# Patient Record
Sex: Male | Born: 1995 | Race: Black or African American | Hispanic: No | Marital: Single | Smoking: Never smoker
Health system: Southern US, Community
[De-identification: ages and names within clinical notes are randomized; demographics above are authoritative.]

## PROBLEM LIST (undated history)

## (undated) DIAGNOSIS — N1330 Unspecified hydronephrosis: Secondary | ICD-10-CM

## (undated) DIAGNOSIS — N3289 Other specified disorders of bladder: Secondary | ICD-10-CM

## (undated) HISTORY — PX: COLONOSCOPY: SHX174

---

## 2016-05-18 DIAGNOSIS — N3289 Other specified disorders of bladder: Secondary | ICD-10-CM

## 2016-05-18 DIAGNOSIS — N1330 Unspecified hydronephrosis: Secondary | ICD-10-CM

## 2016-05-18 HISTORY — DX: Other specified disorders of bladder: N32.89

## 2016-05-18 HISTORY — DX: Unspecified hydronephrosis: N13.30

## 2016-05-26 ENCOUNTER — Other Ambulatory Visit: Payer: Self-pay | Admitting: Gastroenterology

## 2016-05-26 DIAGNOSIS — R1033 Periumbilical pain: Secondary | ICD-10-CM

## 2016-05-26 DIAGNOSIS — R634 Abnormal weight loss: Secondary | ICD-10-CM

## 2016-05-27 ENCOUNTER — Other Ambulatory Visit: Payer: Self-pay

## 2016-06-02 ENCOUNTER — Ambulatory Visit
Admission: RE | Admit: 2016-06-02 | Discharge: 2016-06-02 | Disposition: A | Discharge status: Home / Self Care | Payer: BLUE CROSS/BLUE SHIELD | Source: Ambulatory Visit | Attending: Gastroenterology | Admitting: Gastroenterology

## 2016-06-02 DIAGNOSIS — R634 Abnormal weight loss: Secondary | ICD-10-CM

## 2016-06-02 DIAGNOSIS — R1033 Periumbilical pain: Secondary | ICD-10-CM

## 2016-06-02 MED ORDER — IOPAMIDOL (ISOVUE-300) INJECTION 61%: 100.0000 mL | Freq: Once | INTRAVENOUS | Status: DC | PRN

## 2016-06-14 ENCOUNTER — Other Ambulatory Visit (HOSPITAL_COMMUNITY): Payer: Self-pay | Admitting: Urology

## 2016-06-14 DIAGNOSIS — N1330 Unspecified hydronephrosis: Secondary | ICD-10-CM

## 2016-06-17 ENCOUNTER — Ambulatory Visit (HOSPITAL_COMMUNITY)
Admission: RE | Admit: 2016-06-17 | Discharge: 2016-06-17 | Disposition: A | Discharge status: Home / Self Care | Payer: BLUE CROSS/BLUE SHIELD | Source: Ambulatory Visit | Attending: Urology | Admitting: Urology

## 2016-06-17 DIAGNOSIS — N134 Hydroureter: Secondary | ICD-10-CM | POA: Diagnosis not present

## 2016-06-17 DIAGNOSIS — N1330 Unspecified hydronephrosis: Secondary | ICD-10-CM | POA: Diagnosis not present

## 2016-06-17 MED ORDER — TECHNETIUM TC 99M MERTIATIDE
5.0300 | Freq: Once | INTRAVENOUS | Status: AC | PRN
  Administered 2016-06-17: 5.03 via INTRAVENOUS

## 2016-06-17 MED ORDER — FUROSEMIDE 10 MG/ML IJ SOLN
INTRAMUSCULAR | Status: AC
  Filled 2016-06-17: qty 4

## 2016-06-17 MED ORDER — FUROSEMIDE 10 MG/ML IJ SOLN
27.2700 mg | Freq: Once | INTRAMUSCULAR | Status: AC
  Administered 2016-06-17: 27.27 mg via INTRAVENOUS

## 2016-06-23 ENCOUNTER — Other Ambulatory Visit: Payer: Self-pay | Admitting: Urology

## 2016-07-01 ENCOUNTER — Encounter (HOSPITAL_BASED_OUTPATIENT_CLINIC_OR_DEPARTMENT_OTHER): Payer: Self-pay | Admitting: *Deleted

## 2016-07-01 DIAGNOSIS — N3081 Other cystitis with hematuria: Secondary | ICD-10-CM | POA: Diagnosis not present

## 2016-07-01 DIAGNOSIS — R3915 Urgency of urination: Secondary | ICD-10-CM | POA: Diagnosis not present

## 2016-07-01 DIAGNOSIS — N1330 Unspecified hydronephrosis: Secondary | ICD-10-CM | POA: Diagnosis present

## 2016-07-01 DIAGNOSIS — N401 Enlarged prostate with lower urinary tract symptoms: Secondary | ICD-10-CM | POA: Diagnosis not present

## 2016-07-01 NOTE — Progress Notes (Signed)
[facility]-Instructed Npo after Mn solids, clear liquids only until 0500,then Npo-Hg on arrival.

## 2016-07-03 NOTE — H&P (Signed)
HPI: Alexander Sims is a 20 year-old male with urinary symptoms.  The patient complains of lower urinary tract symptom(s) that include frequency, urgency, straining, and nocturia. He has had the symptom(s) for 20 years. His symptoms did begin gradually. He gets up at night to urinate 3-4 times. He does have urgency.   CT scan 06/02/16 - diffuse bladder thickening is noted with what was read as a "posterior bladder mass" that appears to be the prostate that is enlarged and inhomogeneous more than one would expect for a 20 year old male. in addition bilateral hydronephrosis is identified with what appears to be loss of some renal parenchyma consistent with long-standing hydronephrosis.   He reported that he has always had significant urinary frequency with nocturia and urgency. He said he does have to strain to urinate and will often times have a bowel movement with attempts at urination. He has seen what he described as blood in the urine in the past. Denies any dysuria but indicates that his urinary stream splits and sprays. He's never had an STD. He has never traveled outside of the country. He said after ejaculation he has significant urgency as well.     ALLERGIES: None   MEDICATIONS: None   GU PSH: None   NON-GU PSH: None   GU PMH: None   NON-GU PMH: Encounter for general adult medical examination without abnormal findings, Encounter for preventive health examination    FAMILY HISTORY: None   SOCIAL HISTORY: Marital Status: Single Current Smoking Status: Patient has never smoked.  Has never drank.  Does not drink caffeine.    REVIEW OF SYSTEMS:    GU Review Male:   Patient denies frequent urination, hard to postpone urination, burning/ pain with urination, get up at night to urinate, leakage of urine, stream starts and stops, trouble starting your stream, have to strain to urinate , erection problems, and penile pain.  Gastrointestinal (Upper):   Patient denies vomiting, indigestion/  heartburn, and nausea.  Gastrointestinal (Lower):   Patient denies diarrhea and constipation.  Constitutional:   Patient reports weight loss. Patient denies fever, night sweats, and fatigue.  Skin:   Patient reports skin rash/ lesion and itching.   Eyes:   Patient denies blurred vision and double vision.  Ears/ Nose/ Throat:   Patient denies sore throat and sinus problems.  Hematologic/Lymphatic:   Patient denies swollen glands and easy bruising.  Cardiovascular:   Patient denies leg swelling and chest pains.  Respiratory:   Patient denies cough and shortness of breath.  Endocrine:   Patient denies excessive thirst.  Musculoskeletal:   Patient denies back pain and joint pain.  Neurological:   Patient denies headaches and dizziness.  Psychologic:   Patient denies depression and anxiety.   VITAL SIGNS:    Weight 120 lb / 54.43 kg  Height 70 in / 177.8 cm  BP 109/70 mmHg  Pulse 91 /min  Temperature 97.4 F / 36 C  BMI 17.2 kg/m   GU PHYSICAL EXAMINATION:    Anus and Perineum: No hemorrhoids. No anal stenosis. No rectal fissure, no anal fissure. No edema, no dimple, no perineal tenderness, no anal tenderness.  Scrotum: No lesions. No edema. No cysts. No warts.  Epididymides: Right: no spermatocele, no masses, no cysts, no tenderness, no induration, no enlargement. Left: no spermatocele, no masses, no cysts, no tenderness, no induration, no enlargement.  Testes: No tenderness, no swelling, no enlargement left testes. No tenderness, no swelling, no enlargement right testes. Normal location left testes.  Normal location right testes. No mass, no cyst, no varicocele, no hydrocele left testes. No mass, no cyst, no varicocele, no hydrocele right testes.  Urethral Meatus: Normal size. No lesion, no wart, no discharge, no polyp. Normal location.  Penis: Circumcised, no warts, no cracks. No dorsal Peyronie's plaques, no left corporal Peyronie's plaques, no right corporal Peyronie's plaques, no  scarring, no warts. No balanitis, no meatal stenosis.  Prostate: Prostate 3 + size. Left lobe normal consistency, right lobe normal consistency. Symmetrical lobes. No prostate nodule. Left lobe no tenderness, right lobe no tenderness.   Seminal Vesicles: Nonpalpable.  Sphincter Tone: Normal sphincter. No rectal tenderness. No rectal mass.    MULTI-SYSTEM PHYSICAL EXAMINATION:    Constitutional: Well-nourished. No physical deformities. Normally developed. Good grooming.  Neck: Neck symmetrical, not swollen. Normal tracheal position.  Respiratory: No labored breathing, no use of accessory muscles.   Cardiovascular: Normal temperature, normal extremity pulses, no swelling, no varicosities.  Lymphatic: No enlargement of neck, axillae, groin.  Skin: No paleness, no jaundice, no cyanosis. No lesion, no ulcer, no rash.  Neurologic / Psychiatric: Oriented to time, oriented to place, oriented to person. No depression, no anxiety, no agitation.  Gastrointestinal: No mass, no tenderness, no rigidity, non obese abdomen.  Eyes: Normal conjunctivae. Normal eyelids.  Ears, Nose, Mouth, and Throat: Left ear no scars, no lesions, no masses. Right ear no scars, no lesions, no masses. Nose no scars, no lesions, no masses. Normal hearing. Normal lips.  Musculoskeletal: Normal gait and station of head and neck.     PAST DATA REVIEWED:  Source Of History:  Patient  Records Review:   Previous Hospital Records, POC Tool  X-Ray Review: C.T. Abdomen/Pelvis: Reviewed Films. Reviewed Report. Discussed With Patient.     PROCEDURES:          Urinalysis w/Scope Dipstick Dipstick Cont'd Micro  Color: Yellow Bilirubin: Neg WBC/hpf: NS (Not Seen)  Appearance: Cloudy Ketones: Neg RBC/hpf: 10 - 20/hpf  Specific Gravity: 1.020 Blood: 2+ Bacteria: Rare (0-9/hpf)  pH: 6.0 Protein: Trace Cystals: NS (Not Seen)  Glucose: Neg Urobilinogen: 0.2 Casts: NS (Not Seen)    Nitrites: Neg Trichomonas: Not Present    Leukocyte  Esterase: Neg Mucous: Not Present      Epithelial Cells: 0 - 5/hpf      Yeast: NS (Not Seen)      Sperm: Not Present    ASSESSMENT:      ICD-10 Details  1 GU:   Hydronephrosis Unspec - N13.30 Chronic - He has bilateral hydronephrosis with blunting of the calyces and what appears to be some degree of loss of renal parenchyma. This indicates long-standing obstruction. He appears to be obstructed at the level of the bladder likely due to the marked thickness of his bladder but this needs to be evaluated further. I will obtain a Lasix renogram and evaluate this with retrograde pyelography.  His renogram revealed partial obstruction with no high-grade or complete obstruction identified. 2   Urinary Urgency - R39.15 It appears he has long-standing voiding symptoms that may be due to some form of obstruction versus dysfunctional voiding.  3   BPH w/LUTS - N40.1 He has quite a large prostate noted on CT scan and on DRE. I believe the "mass" Mentioned by the radiologist in his bladder is most likely his prostate. We discussed evaluating him further under anesthesia with possibly even an incision of the prostate.          Notes:   I have discussed  with him the fact that he has a markedly abnormal urinary system including abnormality of the kidneys, ureters, bladder and prostate. This is going to require further evaluation under anesthesia with retrograde pyelography, possible stent placement, possible bladder biopsy, possible prostate biopsy and also possible other procedures based on my intraoperative findings. I have discussed this with him today as well as the outpatient nature of this procedure. He understands it is elected to proceed. I'm going to check a PSA and his creatinine and then obtained a Lasix renogram.    PLAN: Cystoscopy, bilateral retrograde pyelograms, possible stent placement, possible ureteroscopy, possible transurethral incision of the prostate.

## 2016-07-04 ENCOUNTER — Encounter (HOSPITAL_BASED_OUTPATIENT_CLINIC_OR_DEPARTMENT_OTHER): Payer: Self-pay

## 2016-07-04 ENCOUNTER — Ambulatory Visit (HOSPITAL_BASED_OUTPATIENT_CLINIC_OR_DEPARTMENT_OTHER)
Admission: RE | Admit: 2016-07-04 | Discharge: 2016-07-04 | Disposition: A | Discharge status: Home / Self Care | Payer: BLUE CROSS/BLUE SHIELD | Source: Ambulatory Visit | Attending: Urology | Admitting: Urology

## 2016-07-04 ENCOUNTER — Ambulatory Visit (HOSPITAL_BASED_OUTPATIENT_CLINIC_OR_DEPARTMENT_OTHER): Payer: BLUE CROSS/BLUE SHIELD | Admitting: Anesthesiology

## 2016-07-04 ENCOUNTER — Encounter (HOSPITAL_BASED_OUTPATIENT_CLINIC_OR_DEPARTMENT_OTHER)
Admission: RE | Disposition: A | Discharge status: Home / Self Care | Payer: Self-pay | Source: Ambulatory Visit | Attending: Urology

## 2016-07-04 DIAGNOSIS — N3081 Other cystitis with hematuria: Secondary | ICD-10-CM | POA: Insufficient documentation

## 2016-07-04 DIAGNOSIS — N401 Enlarged prostate with lower urinary tract symptoms: Secondary | ICD-10-CM | POA: Insufficient documentation

## 2016-07-04 DIAGNOSIS — N329 Bladder disorder, unspecified: Secondary | ICD-10-CM

## 2016-07-04 DIAGNOSIS — R3915 Urgency of urination: Secondary | ICD-10-CM | POA: Insufficient documentation

## 2016-07-04 DIAGNOSIS — N1330 Unspecified hydronephrosis: Secondary | ICD-10-CM | POA: Insufficient documentation

## 2016-07-04 HISTORY — PX: CYSTOSCOPY W/ RETROGRADES: SHX1426

## 2016-07-04 HISTORY — DX: Other specified disorders of bladder: N32.89

## 2016-07-04 HISTORY — DX: Unspecified hydronephrosis: N13.30

## 2016-07-04 HISTORY — PX: TRANSURETHRAL RESECTION OF BLADDER TUMOR: SHX2575

## 2016-07-04 LAB — POCT I-STAT, CHEM 8
BUN: 7 mg/dL (ref 6–20)
Calcium, Ion: 1.33 mmol/L (ref 1.15–1.40)
Chloride: 98 mmol/L — ABNORMAL LOW (ref 101–111)
Creatinine, Ser: 0.8 mg/dL (ref 0.61–1.24)
Glucose, Bld: 96 mg/dL (ref 65–99)
HCT: 46 % (ref 39.0–52.0)
Hemoglobin: 15.6 g/dL (ref 13.0–17.0)
Potassium: 3.7 mmol/L (ref 3.5–5.1)
Sodium: 142 mmol/L (ref 135–145)
TCO2: 28 mmol/L (ref 0–100)

## 2016-07-04 LAB — PSA: PSA: 0.54 ng/mL (ref 0.00–4.00)

## 2016-07-04 LAB — POCT HEMOGLOBIN-HEMACUE: Hemoglobin: 15 g/dL (ref 13.0–17.0)

## 2016-07-04 SURGERY — CYSTOSCOPY, WITH RETROGRADE PYELOGRAM
Anesthesia: General | Site: Bladder

## 2016-07-04 MED ORDER — STERILE WATER FOR IRRIGATION IR SOLN
Status: DC | PRN
  Administered 2016-07-04: 3000 mL

## 2016-07-04 MED ORDER — FENTANYL CITRATE (PF) 100 MCG/2ML IJ SOLN
INTRAMUSCULAR | Status: AC
  Filled 2016-07-04: qty 2

## 2016-07-04 MED ORDER — DEXAMETHASONE SODIUM PHOSPHATE 4 MG/ML IJ SOLN
INTRAMUSCULAR | Status: DC | PRN
  Administered 2016-07-04: 10 mg via INTRAVENOUS

## 2016-07-04 MED ORDER — PROMETHAZINE HCL 25 MG/ML IJ SOLN
6.2500 mg | INTRAMUSCULAR | Status: DC | PRN
Start: 2016-07-04 — End: 2016-07-04
  Filled 2016-07-04: qty 1

## 2016-07-04 MED ORDER — DEXAMETHASONE SODIUM PHOSPHATE 10 MG/ML IJ SOLN
INTRAMUSCULAR | Status: AC
  Filled 2016-07-04: qty 1

## 2016-07-04 MED ORDER — CIPROFLOXACIN IN D5W 400 MG/200ML IV SOLN
INTRAVENOUS | Status: AC
  Filled 2016-07-04: qty 200

## 2016-07-04 MED ORDER — ONDANSETRON HCL 4 MG/2ML IJ SOLN
INTRAMUSCULAR | Status: DC | PRN
  Administered 2016-07-04: 4 mg via INTRAVENOUS

## 2016-07-04 MED ORDER — LIDOCAINE 2% (20 MG/ML) 5 ML SYRINGE
INTRAMUSCULAR | Status: AC
  Filled 2016-07-04: qty 5

## 2016-07-04 MED ORDER — MIDAZOLAM HCL 5 MG/5ML IJ SOLN
INTRAMUSCULAR | Status: DC | PRN
  Administered 2016-07-04: 2 mg via INTRAVENOUS

## 2016-07-04 MED ORDER — IOHEXOL 300 MG/ML  SOLN
INTRAMUSCULAR | Status: DC | PRN
  Administered 2016-07-04: 10 mL

## 2016-07-04 MED ORDER — FENTANYL CITRATE (PF) 100 MCG/2ML IJ SOLN
INTRAMUSCULAR | Status: DC | PRN
  Administered 2016-07-04 (×5): 25 ug via INTRAVENOUS
  Administered 2016-07-04: 50 ug via INTRAVENOUS
  Administered 2016-07-04: 25 ug via INTRAVENOUS

## 2016-07-04 MED ORDER — FENTANYL CITRATE (PF) 100 MCG/2ML IJ SOLN
25.0000 ug | INTRAMUSCULAR | Status: DC | PRN
  Filled 2016-07-04: qty 1

## 2016-07-04 MED ORDER — SODIUM CHLORIDE 0.9 % IR SOLN
Status: DC | PRN
  Administered 2016-07-04 (×5): 3000 mL via INTRAVESICAL

## 2016-07-04 MED ORDER — PROPOFOL 10 MG/ML IV BOLUS
INTRAVENOUS | Status: AC
  Filled 2016-07-04: qty 40

## 2016-07-04 MED ORDER — MIDAZOLAM HCL 2 MG/2ML IJ SOLN
INTRAMUSCULAR | Status: AC
  Filled 2016-07-04: qty 2

## 2016-07-04 MED ORDER — PROPOFOL 10 MG/ML IV BOLUS
INTRAVENOUS | Status: DC | PRN
  Administered 2016-07-04: 50 mg via INTRAVENOUS
  Administered 2016-07-04: 200 mg via INTRAVENOUS

## 2016-07-04 MED ORDER — LACTATED RINGERS IV SOLN
INTRAVENOUS | Status: DC
  Administered 2016-07-04 (×2): via INTRAVENOUS
  Filled 2016-07-04: qty 1000

## 2016-07-04 MED ORDER — LIDOCAINE 2% (20 MG/ML) 5 ML SYRINGE
INTRAMUSCULAR | Status: DC | PRN
  Administered 2016-07-04: 100 mg via INTRAVENOUS

## 2016-07-04 MED ORDER — PHENAZOPYRIDINE HCL 200 MG PO TABS: 200.0000 mg | ORAL_TABLET | Freq: Three times a day (TID) | ORAL | 0 refills | Status: AC | PRN

## 2016-07-04 MED ORDER — LIDOCAINE HCL 2 % EX GEL
CUTANEOUS | Status: DC | PRN
  Administered 2016-07-04: 1

## 2016-07-04 MED ORDER — HYDROCODONE-ACETAMINOPHEN 10-325 MG PO TABS: 1.0000 | ORAL_TABLET | ORAL | 0 refills | Status: AC | PRN

## 2016-07-04 MED ORDER — CIPROFLOXACIN IN D5W 400 MG/200ML IV SOLN
400.0000 mg | INTRAVENOUS | Status: AC
  Administered 2016-07-04: 400 mg via INTRAVENOUS
  Filled 2016-07-04: qty 200

## 2016-07-04 MED ORDER — ONDANSETRON HCL 4 MG/2ML IJ SOLN
INTRAMUSCULAR | Status: AC
  Filled 2016-07-04: qty 2

## 2016-07-04 SURGICAL SUPPLY — 30 items
BAG DRAIN URO-CYSTO SKYTR STRL (DRAIN) ×3 IMPLANT
BAG URINE DRAINAGE (UROLOGICAL SUPPLIES) IMPLANT
BAG URINE LEG 19OZ MD ST LTX (BAG) IMPLANT
CATH FOLEY 2WAY SLVR  5CC 20FR (CATHETERS)
CATH FOLEY 2WAY SLVR  5CC 22FR (CATHETERS)
CATH FOLEY 2WAY SLVR  5CC 24FR (CATHETERS) ×1
CATH FOLEY 2WAY SLVR 5CC 20FR (CATHETERS) IMPLANT
CATH FOLEY 2WAY SLVR 5CC 22FR (CATHETERS) IMPLANT
CATH FOLEY 2WAY SLVR 5CC 24FR (CATHETERS) ×2 IMPLANT
CATH FOLEY 3WAY 20FR (CATHETERS) IMPLANT
CATH INTERMIT  6FR 70CM (CATHETERS) ×3 IMPLANT
CLOTH BEACON ORANGE TIMEOUT ST (SAFETY) ×3 IMPLANT
ELECT REM PT RETURN 9FT ADLT (ELECTROSURGICAL) ×3
ELECTRODE REM PT RTRN 9FT ADLT (ELECTROSURGICAL) ×2 IMPLANT
GLOVE BIO SURGEON STRL SZ8 (GLOVE) ×3 IMPLANT
GOWN STRL REUS W/ TWL LRG LVL3 (GOWN DISPOSABLE) ×2 IMPLANT
GOWN STRL REUS W/ TWL XL LVL3 (GOWN DISPOSABLE) ×2 IMPLANT
GOWN STRL REUS W/TWL LRG LVL3 (GOWN DISPOSABLE) ×1
GOWN STRL REUS W/TWL XL LVL3 (GOWN DISPOSABLE) ×1
GUIDEWIRE 0.038 PTFE COATED (WIRE) IMPLANT
GUIDEWIRE ANG ZIPWIRE 038X150 (WIRE) IMPLANT
GUIDEWIRE STR DUAL SENSOR (WIRE) ×3 IMPLANT
IV NS IRRIG 3000ML ARTHROMATIC (IV SOLUTION) ×15 IMPLANT
KIT ROOM TURNOVER WOR (KITS) ×3 IMPLANT
LOOP CUT BIPOLAR 24F LRG (ELECTROSURGICAL) ×3 IMPLANT
MANIFOLD NEPTUNE II (INSTRUMENTS) ×3 IMPLANT
PACK CYSTO (CUSTOM PROCEDURE TRAY) ×3 IMPLANT
SYRINGE IRR TOOMEY STRL 70CC (SYRINGE) IMPLANT
TUBE CONNECTING 12X1/4 (SUCTIONS) ×3 IMPLANT
WATER STERILE IRR 3000ML UROMA (IV SOLUTION) ×3 IMPLANT

## 2016-07-04 NOTE — Anesthesia Postprocedure Evaluation (Signed)
Anesthesia Post Note  Patient: Alexander Sims  Procedure(s) Performed: Procedure(s) (LRB): CYSTOSCOPY WITH RETROGRADE PYELOGRAM, BLADDER BIOPSY, PROSTATE INCISION (Bilateral) TRANSURETHRAL RESECTION OF BLADDER TUMOR (TURBT) (N/A)  Patient location during evaluation: PACU Anesthesia Type: General Level of consciousness: awake and alert Pain management: pain level controlled Vital Signs Assessment: post-procedure vital signs reviewed and stable Respiratory status: spontaneous breathing, nonlabored ventilation, respiratory function stable and patient connected to nasal cannula oxygen Cardiovascular status: blood pressure returned to baseline and stable Postop Assessment: no signs of nausea or vomiting Anesthetic complications: no       Last Vitals:  Vitals:   07/04/16 0913 07/04/16 1207  BP: 125/78 (!) 133/97  Pulse: 77 88  Resp: 16 (!) 8  Temp: 36.5 C (!) 35.6 C    Last Pain:  Vitals:   07/04/16 0913  TempSrc: Oral                 Kennieth RadFitzgerald, Alexander Sims

## 2016-07-04 NOTE — Anesthesia Preprocedure Evaluation (Addendum)
Anesthesia Evaluation  Patient identified by MRN, date of birth, ID band Patient awake    Reviewed: Allergy & Precautions, NPO status , Patient's Chart, lab work & pertinent test results  Airway Mallampati: II  TM Distance: >3 FB Neck ROM: Full    Dental  (+) Dental Advisory Given   Pulmonary neg pulmonary ROS,    breath sounds clear to auscultation       Cardiovascular negative cardio ROS   Rhythm:Regular Rate:Normal     Neuro/Psych negative neurological ROS     GI/Hepatic negative GI ROS, Neg liver ROS,   Endo/Other  negative endocrine ROS  Renal/GU Renal disease     Musculoskeletal   Abdominal   Peds  Hematology negative hematology ROS (+)   Anesthesia Other Findings   Reproductive/Obstetrics                            Lab Results  Component Value Date   HGB 15.0 07/04/2016   No results found for: CREATININE, BUN, NA, K, CL, CO2  Anesthesia Physical Anesthesia Plan  ASA: II  Anesthesia Plan: General   Post-op Pain Management:    Induction: Intravenous  Airway Management Planned: LMA  Additional Equipment:   Intra-op Plan:   Post-operative Plan: Extubation in OR  Informed Consent: I have reviewed the patients History and Physical, chart, labs and discussed the procedure including the risks, benefits and alternatives for the proposed anesthesia with the patient or authorized representative who has indicated his/her understanding and acceptance.   Dental advisory given  Plan Discussed with: CRNA  Anesthesia Plan Comments:         Anesthesia Quick Evaluation

## 2016-07-04 NOTE — Transfer of Care (Signed)
Immediate Anesthesia Transfer of Care Note  Patient: Alexander Sims  Procedure(s) Performed: Procedure(s) (LRB): CYSTOSCOPY WITH RETROGRADE PYELOGRAM, BLADDER BIOPSY, PROSTATE INCISION (Bilateral) TRANSURETHRAL RESECTION OF BLADDER TUMOR (TURBT) (N/A)  Patient Location: PACU  Anesthesia Type: General  Level of Consciousness: awake, sedated, patient cooperative and responds to stimulation  Airway & Oxygen Therapy: Patient Spontanous Breathing and Patient connected to [place] O2  Post-op Assessment: Report given to PACU RN, Post -op Vital signs reviewed and stable and Patient moving all extremities  Post vital signs: Reviewed and stable  Complications: No apparent anesthesia complications

## 2016-07-04 NOTE — Op Note (Addendum)
PATIENT:  Alexander Sims  PRE-OPERATIVE DIAGNOSIS: 1. Bilateral hydronephrosis 2. Thick bladder wall. 3. Possible prostate lesion.  POST-OPERATIVE DIAGNOSIS: Same  PROCEDURE: 1. Cystoscopy with resectional biopsy of bladder neck and trigone. (3 cm.) 2. Transurethral incision of the prostate SURGEON:  Garnett FarmMark C Kenniel Bergsma  INDICATION: Alexander Sims is a 20 year old male with a long-standing history of irritative as well as obstructive voiding symptoms. He was found on CT scan had a markedly thickened bladder circumferentially with bilateral hydronephrosis and changes consistent with this being a chronic condition. In addition there appeared to be an abnormality of the prostate with an inhomogeneous internal texture of the prostate with a possible intravesical component of either the prostate or some other form of neoplasm arising from the area of the prostate. He reports he has seen hematuria in the past. A Lasix renogram revealed that he was not completely obstructed and a preoperative serum creatinine was found to be normal at 0.8. He is brought to the operating room today for further evaluation of the above findings. I obtained a PSA preoperatively.  ANESTHESIA:  General  EBL:  Minimal  DRAINS: None  LOCAL MEDICATIONS USED: 2% lidocaine jelly per urethra  SPECIMEN:  Lesion from bladder neck, trigone and portion of prostate to pathology.  Description of procedure: After informed consent the patient was taken to the operating room and placed on the table in a supine position. General anesthesia was then administered. Once fully anesthetized the patient was moved to the dorsal lithotomy position and the genitalia were sterilely prepped and draped in standard fashion. An official timeout was then performed.  The 23 French rigid cystoscope with 30 lens was passed down the urethra which is noted be entirely normal. The sphincter was intact. There was no evidence of posterior urethral valves. The prostate  appeared to have a frondular areas of abnormality starting at about the mid prostate and extending to the bladder neck with large fingers of tissue protruding into the bladder from the bladder neck circumferentially. The trigone appeared to be involved with this process with some cobblestoning and reddish appearance more on the right than the left with no definite ureteral orifice identifiable. The bladder itself had normal-appearing mucosa upon entering the bladder with no bladder tumors or worrisome lesions of the mucosa once inside the bladder and beyond the area of the trigone. I felt that his bladder capacity seemed low so under anesthesia he was filled to capacity and found to have only a 250 mL capacity bladder.  The cystoscope was removed and I inserted the resectoscope sheath using a 26 French sheath and 30 lens with visual obturator. I removed the visual obturator and replaced this with the resectoscope element. I then began resecting this frondular tumor from the bladder neck circumferentially. It seemed very soft with very little resistance to resection and did not seem to be particularly vascular in nature. Bleeding points were cauterized as they were encountered. I then did a very long and thorough evaluation in an attempt to identify the right and left ureteral orifice but was unsuccessful. It appeared the process had enveloped both right and left orifice and so what I elected to do was resect through this area since it was clearly abnormal in appearance and was able to identify both of the ureters. As I resected through these I used the cutting current and could see the ureter protruding outward almost in a tubular fashion with no bleeding occurring around the so I did not need to use  cautery and feel that this will likely improve his kidney drainage with little risk of developing stricture although this will remain to be seen. I then cauterized all areas of bleeding and used the Microvasive  evacuator to remove all portions of the resected bladder neck lesion from the bladder. I then proceeded to perform a transurethral incision of the prostate.  Transurethral incision of the prostate was performed using the loop and cutting at the 6:00 position from the bladder neck nearly out to the veru. This was done due to his significant obstructive type symptoms but also in order to obtain tissue from the prostate. This was also sent to pathology. Cautery was used to clear up any further bleeding and at the end of the procedure there was no active bleeding occurring.  I elected not to proceed with retrograde pyelography due to the fact that initially I was unable to identify the ureteral orifice on either side and once I had resected away the tumor that had involved each of these I felt that attempting cannulization of these ureters could potentially injure or invert the ureter. Since I felt that my findings of this process involving both of his ureteral orifices was most likely at least a contributing factor in his hydronephrosis although his bladder wall thickening with marked loss of compliance of his bladder is also likely a factor.  I removed the resectoscope and instilled 2% lidocaine jelly in the urethra and applied a penile clamp. The patient was then awakened and taken to the recovery room in stable and satisfactory condition. He tolerated the procedure well with no intraoperative complications.  PLAN OF CARE: Discharge to home after PACU  PATIENT DISPOSITION:  PACU - hemodynamically stable.

## 2016-07-04 NOTE — Anesthesia Procedure Notes (Signed)
Procedure Name: LMA Insertion Date/Time: 07/04/2016 10:59 AM Performed by: Jessica PriestBEESON, Shihab States C Pre-anesthesia Checklist: Patient identified, Emergency Drugs available, Suction available and Patient being monitored Patient Re-evaluated:Patient Re-evaluated prior to inductionOxygen Delivery Method: Circle system utilized Preoxygenation: Pre-oxygenation with 100% oxygen Intubation Type: IV induction Ventilation: Mask ventilation without difficulty LMA: LMA inserted LMA Size: 4.0 Number of attempts: 1 Airway Equipment and Method: Bite block Placement Confirmation: positive ETCO2 and breath sounds checked- equal and bilateral Tube secured with: Tape Dental Injury: Teeth and Oropharynx as per pre-operative assessment

## 2016-07-04 NOTE — Discharge Instructions (Signed)

## 2016-07-05 ENCOUNTER — Encounter (HOSPITAL_BASED_OUTPATIENT_CLINIC_OR_DEPARTMENT_OTHER): Payer: Self-pay | Admitting: Urology

## 2016-10-14 ENCOUNTER — Emergency Department (HOSPITAL_COMMUNITY): Payer: BLUE CROSS/BLUE SHIELD | Admitting: Certified Registered Nurse Anesthetist

## 2016-10-14 ENCOUNTER — Encounter (HOSPITAL_COMMUNITY)
Admission: EM | Disposition: A | Discharge status: Home / Self Care | Payer: Self-pay | Source: Home / Self Care | Attending: Urology

## 2016-10-14 ENCOUNTER — Encounter (HOSPITAL_COMMUNITY): Payer: Self-pay | Admitting: *Deleted

## 2016-10-14 ENCOUNTER — Emergency Department (HOSPITAL_COMMUNITY): Payer: BLUE CROSS/BLUE SHIELD

## 2016-10-14 ENCOUNTER — Emergency Department (HOSPITAL_COMMUNITY)
Admit: 2016-10-14 | Discharge: 2016-10-14 | Disposition: A | Discharge status: Home / Self Care | Payer: BLUE CROSS/BLUE SHIELD | Attending: Urology | Admitting: Urology

## 2016-10-14 ENCOUNTER — Inpatient Hospital Stay (HOSPITAL_COMMUNITY)
Admission: EM | Admit: 2016-10-14 | Discharge: 2016-10-18 | DRG: 669 | Disposition: A | Discharge status: Home / Self Care | Payer: BLUE CROSS/BLUE SHIELD | Attending: Urology | Admitting: Urology

## 2016-10-14 DIAGNOSIS — N400 Enlarged prostate without lower urinary tract symptoms: Secondary | ICD-10-CM | POA: Diagnosis present

## 2016-10-14 DIAGNOSIS — N1330 Unspecified hydronephrosis: Secondary | ICD-10-CM

## 2016-10-14 DIAGNOSIS — D631 Anemia in chronic kidney disease: Secondary | ICD-10-CM | POA: Diagnosis present

## 2016-10-14 DIAGNOSIS — N179 Acute kidney failure, unspecified: Secondary | ICD-10-CM | POA: Diagnosis present

## 2016-10-14 DIAGNOSIS — N138 Other obstructive and reflux uropathy: Secondary | ICD-10-CM | POA: Diagnosis present

## 2016-10-14 DIAGNOSIS — E875 Hyperkalemia: Secondary | ICD-10-CM | POA: Diagnosis present

## 2016-10-14 DIAGNOSIS — E872 Acidosis: Secondary | ICD-10-CM | POA: Diagnosis present

## 2016-10-14 DIAGNOSIS — R634 Abnormal weight loss: Secondary | ICD-10-CM | POA: Diagnosis present

## 2016-10-14 DIAGNOSIS — Z681 Body mass index (BMI) 19 or less, adult: Secondary | ICD-10-CM

## 2016-10-14 DIAGNOSIS — Z9889 Other specified postprocedural states: Secondary | ICD-10-CM

## 2016-10-14 DIAGNOSIS — N3090 Cystitis, unspecified without hematuria: Secondary | ICD-10-CM | POA: Diagnosis present

## 2016-10-14 DIAGNOSIS — R531 Weakness: Secondary | ICD-10-CM | POA: Diagnosis present

## 2016-10-14 DIAGNOSIS — N131 Hydronephrosis with ureteral stricture, not elsewhere classified: Secondary | ICD-10-CM

## 2016-10-14 HISTORY — PX: PROSTATE BIOPSY: SHX241

## 2016-10-14 HISTORY — PX: CYSTOSCOPY W/ URETERAL STENT PLACEMENT: SHX1429

## 2016-10-14 LAB — BASIC METABOLIC PANEL
ANION GAP: 12 (ref 5–15)
Anion gap: 15 (ref 5–15)
BUN: 110 mg/dL — ABNORMAL HIGH (ref 6–20)
BUN: 97 mg/dL — ABNORMAL HIGH (ref 6–20)
CHLORIDE: 108 mmol/L (ref 101–111)
CHLORIDE: 109 mmol/L (ref 101–111)
CO2: 18 mmol/L — AB (ref 22–32)
CO2: 16 mmol/L — AB (ref 22–32)
CREATININE: 23.96 mg/dL — AB (ref 0.61–1.24)
CREATININE: 24.74 mg/dL — AB (ref 0.61–1.24)
Calcium: 9.4 mg/dL (ref 8.9–10.3)
Calcium: 9.6 mg/dL (ref 8.9–10.3)
GFR calc non Af Amer: 2 mL/min — ABNORMAL LOW (ref >60)
GFR calc non Af Amer: 2 mL/min — ABNORMAL LOW (ref >60)
GFR, EST AFRICAN AMERICAN: 3 mL/min — AB (ref >60)
GFR, EST AFRICAN AMERICAN: 3 mL/min — AB (ref >60)
GLUCOSE: 46 mg/dL — AB (ref 65–99)
Glucose, Bld: 98 mg/dL (ref 65–99)
Potassium: 6.2 mmol/L — ABNORMAL HIGH (ref 3.5–5.1)
Potassium: 4.6 mmol/L (ref 3.5–5.1)
SODIUM: 138 mmol/L (ref 135–145)
Sodium: 140 mmol/L (ref 135–145)

## 2016-10-14 LAB — CBC WITH DIFFERENTIAL/PLATELET
BASOS PCT: 0 %
Basophils Absolute: 0 10*3/uL (ref 0.0–0.1)
EOS ABS: 0.2 10*3/uL (ref 0.0–0.7)
EOS PCT: 3 %
HCT: 27.6 % — ABNORMAL LOW (ref 39.0–52.0)
Hemoglobin: 9.8 g/dL — ABNORMAL LOW (ref 13.0–17.0)
LYMPHS ABS: 1.4 10*3/uL (ref 0.7–4.0)
Lymphocytes Relative: 25 %
MCH: 27.9 pg (ref 26.0–34.0)
MCHC: 35.5 g/dL (ref 30.0–36.0)
MCV: 78.6 fL (ref 78.0–100.0)
MONOS PCT: 8 %
Monocytes Absolute: 0.5 10*3/uL (ref 0.1–1.0)
Neutro Abs: 3.6 10*3/uL (ref 1.7–7.7)
Neutrophils Relative %: 64 %
PLATELETS: 232 10*3/uL (ref 150–400)
RBC: 3.51 MIL/uL — ABNORMAL LOW (ref 4.22–5.81)
RDW: 13.8 % (ref 11.5–15.5)
WBC: 5.6 10*3/uL (ref 4.0–10.5)

## 2016-10-14 LAB — SURGICAL PCR SCREEN
MRSA, PCR: NEGATIVE
Staphylococcus aureus: NEGATIVE

## 2016-10-14 SURGERY — CYSTOSCOPY, WITH RETROGRADE PYELOGRAM AND URETERAL STENT INSERTION
Anesthesia: General | Site: Prostate

## 2016-10-14 MED ORDER — ONDANSETRON HCL 4 MG/2ML IJ SOLN: 4.0000 mg | INTRAMUSCULAR | Status: DC | PRN

## 2016-10-14 MED ORDER — PROPOFOL 10 MG/ML IV BOLUS
INTRAVENOUS | Status: DC | PRN
  Administered 2016-10-14: 160 mg via INTRAVENOUS

## 2016-10-14 MED ORDER — DEXTROSE 50 % IV SOLN
1.0000 | Freq: Once | INTRAVENOUS | Status: AC
  Administered 2016-10-14: 50 mL via INTRAVENOUS
  Filled 2016-10-14: qty 50

## 2016-10-14 MED ORDER — PROPOFOL 10 MG/ML IV BOLUS
INTRAVENOUS | Status: AC
  Filled 2016-10-14: qty 20

## 2016-10-14 MED ORDER — EPHEDRINE SULFATE-NACL 50-0.9 MG/10ML-% IV SOSY
PREFILLED_SYRINGE | INTRAVENOUS | Status: DC | PRN
  Administered 2016-10-14: 15 mg via INTRAVENOUS

## 2016-10-14 MED ORDER — FENTANYL CITRATE (PF) 100 MCG/2ML IJ SOLN
INTRAMUSCULAR | Status: DC | PRN
  Administered 2016-10-14 (×2): 25 ug via INTRAVENOUS

## 2016-10-14 MED ORDER — SODIUM CHLORIDE 0.9 % IV SOLN
INTRAVENOUS | Status: DC
  Administered 2016-10-14: 22:00:00 via INTRAVENOUS

## 2016-10-14 MED ORDER — SODIUM CHLORIDE 0.9 % IV SOLN
1.0000 g | Freq: Once | INTRAVENOUS | Status: AC
  Administered 2016-10-14: 1 g via INTRAVENOUS
  Filled 2016-10-14: qty 10

## 2016-10-14 MED ORDER — SODIUM CHLORIDE 0.9 % IV SOLN
INTRAVENOUS | Status: DC
  Administered 2016-10-14: 18:00:00 via INTRAVENOUS

## 2016-10-14 MED ORDER — HYDROMORPHONE HCL 1 MG/ML IJ SOLN
0.5000 mg | INTRAMUSCULAR | Status: DC | PRN
  Administered 2016-10-14: 0.5 mg via INTRAVENOUS
  Administered 2016-10-15 (×2): 1 mg via INTRAVENOUS
  Filled 2016-10-14 (×3): qty 1

## 2016-10-14 MED ORDER — IOPAMIDOL (ISOVUE-300) INJECTION 61%
INTRAVENOUS | Status: AC
  Filled 2016-10-14: qty 50

## 2016-10-14 MED ORDER — HYDROCODONE-ACETAMINOPHEN 5-325 MG PO TABS
1.0000 | ORAL_TABLET | ORAL | Status: DC | PRN
  Administered 2016-10-16 (×3): 2 via ORAL
  Filled 2016-10-14 (×3): qty 2

## 2016-10-14 MED ORDER — ONDANSETRON HCL 4 MG/2ML IJ SOLN
INTRAMUSCULAR | Status: DC | PRN
  Administered 2016-10-14: 4 mg via INTRAVENOUS

## 2016-10-14 MED ORDER — CISATRACURIUM BESYLATE 20 MG/10ML IV SOLN
INTRAVENOUS | Status: AC
  Filled 2016-10-14: qty 10

## 2016-10-14 MED ORDER — LIDOCAINE 2% (20 MG/ML) 5 ML SYRINGE
INTRAMUSCULAR | Status: DC | PRN
  Administered 2016-10-14: 60 mg via INTRAVENOUS

## 2016-10-14 MED ORDER — DEXTROSE 5 % IV SOLN
1.0000 g | Freq: Once | INTRAVENOUS | Status: AC
  Administered 2016-10-14: 1 g via INTRAVENOUS
  Filled 2016-10-14: qty 10

## 2016-10-14 MED ORDER — LIDOCAINE 2% (20 MG/ML) 5 ML SYRINGE
INTRAMUSCULAR | Status: AC
  Filled 2016-10-14: qty 5

## 2016-10-14 MED ORDER — FENTANYL CITRATE (PF) 100 MCG/2ML IJ SOLN
INTRAMUSCULAR | Status: AC
  Filled 2016-10-14: qty 2

## 2016-10-14 MED ORDER — SODIUM CHLORIDE 0.9 % IR SOLN
Status: DC | PRN
  Administered 2016-10-14: 13000 mL

## 2016-10-14 MED ORDER — 0.9 % SODIUM CHLORIDE (POUR BTL) OPTIME
TOPICAL | Status: DC | PRN
  Administered 2016-10-14: 1000 mL

## 2016-10-14 MED ORDER — DEXAMETHASONE SODIUM PHOSPHATE 10 MG/ML IJ SOLN
INTRAMUSCULAR | Status: DC | PRN
  Administered 2016-10-14: 10 mg via INTRAVENOUS

## 2016-10-14 MED ORDER — FENTANYL CITRATE (PF) 100 MCG/2ML IJ SOLN: 25.0000 ug | INTRAMUSCULAR | Status: DC | PRN

## 2016-10-14 MED ORDER — DEXAMETHASONE SODIUM PHOSPHATE 10 MG/ML IJ SOLN
INTRAMUSCULAR | Status: AC
  Filled 2016-10-14: qty 1

## 2016-10-14 MED ORDER — PHENYLEPHRINE 40 MCG/ML (10ML) SYRINGE FOR IV PUSH (FOR BLOOD PRESSURE SUPPORT)
PREFILLED_SYRINGE | INTRAVENOUS | Status: DC | PRN
  Administered 2016-10-14 (×3): 80 ug via INTRAVENOUS

## 2016-10-14 MED ORDER — STERILE WATER FOR IRRIGATION IR SOLN
Status: DC | PRN
  Administered 2016-10-14: 500 mL

## 2016-10-14 MED ORDER — SODIUM POLYSTYRENE SULFONATE 15 GM/60ML PO SUSP
45.0000 g | Freq: Once | ORAL | Status: AC
  Administered 2016-10-14: 45 g via ORAL
  Filled 2016-10-14 (×2): qty 180

## 2016-10-14 MED ORDER — ONDANSETRON HCL 4 MG/2ML IJ SOLN
INTRAMUSCULAR | Status: AC
  Filled 2016-10-14: qty 2

## 2016-10-14 MED ORDER — SENNOSIDES-DOCUSATE SODIUM 8.6-50 MG PO TABS
1.0000 | ORAL_TABLET | Freq: Two times a day (BID) | ORAL | Status: DC
  Administered 2016-10-14 – 2016-10-15 (×2): 1 via ORAL
  Filled 2016-10-14 (×5): qty 1

## 2016-10-14 MED ORDER — INSULIN ASPART 100 UNIT/ML ~~LOC~~ SOLN
10.0000 [IU] | Freq: Once | SUBCUTANEOUS | Status: AC
  Administered 2016-10-14: 10 [IU] via SUBCUTANEOUS
  Filled 2016-10-14: qty 1

## 2016-10-14 SURGICAL SUPPLY — 24 items
BAG URO CATCHER STRL LF (MISCELLANEOUS) ×3 IMPLANT
BASKET ZERO TIP NITINOL 2.4FR (BASKET) IMPLANT
CATH FOLEY 3WAY 30CC 24FR (CATHETERS) ×1
CATH INTERMIT  6FR 70CM (CATHETERS) IMPLANT
CATH URTH STD 24FR FL 3W 2 (CATHETERS) ×2 IMPLANT
CLOTH BEACON ORANGE TIMEOUT ST (SAFETY) ×3 IMPLANT
ELECT LOOP 22F BIPOLAR SML (ELECTROSURGICAL) ×3
ELECTRODE LOOP 22F BIPOLAR SML (ELECTROSURGICAL) ×2 IMPLANT
GLOVE BIO SURGEON STRL SZ 6.5 (GLOVE) ×3 IMPLANT
GLOVE BIOGEL M STRL SZ7.5 (GLOVE) ×3 IMPLANT
GLOVE BIOGEL PI IND STRL 6.5 (GLOVE) ×2 IMPLANT
GLOVE BIOGEL PI INDICATOR 6.5 (GLOVE) ×1
GOWN STRL REUS W/ TWL XL LVL3 (GOWN DISPOSABLE) ×2 IMPLANT
GOWN STRL REUS W/TWL LRG LVL3 (GOWN DISPOSABLE) ×6 IMPLANT
GOWN STRL REUS W/TWL XL LVL3 (GOWN DISPOSABLE) ×1
GUIDEWIRE ANG ZIPWIRE 038X150 (WIRE) ×3 IMPLANT
GUIDEWIRE STR DUAL SENSOR (WIRE) ×6 IMPLANT
INSTR BIOPSY MAXCORE 18GX20 (NEEDLE) ×3 IMPLANT
MANIFOLD NEPTUNE II (INSTRUMENTS) ×3 IMPLANT
PACK CYSTO (CUSTOM PROCEDURE TRAY) ×3 IMPLANT
PLUG CATH AND CAP STER (CATHETERS) IMPLANT
SYR 30ML LL (SYRINGE) ×3 IMPLANT
SYRINGE IRR TOOMEY STRL 70CC (SYRINGE) ×3 IMPLANT
TUBING CONNECTING 10 (TUBING) ×3 IMPLANT

## 2016-10-14 NOTE — Consult Note (Signed)
21 year old male was evaluated in 2017 for a 40 pound weight loss and diarrhea. (Hx obtained from med record as pt sleepy post op).  CT Abdomen Jun 02, 2016 revealed: Moderate to marked diffuse bladder wall thickening and 2.9 x 2.4 x 2.0 cm mass in the inferior aspect of the urinary bladder, adjacent to a moderately enlarged and heterogeneous prostate. These changes are producing moderate-to-marked bilateral hydronephrosis and hydroureter. The bladder mass could represent a primary bladder malignancy or extension of the enlarged and heterogeneous prostate gland into the base of the urinary bladder. The enlarged and heterogeneous prostate gland could be due to prostatitis or, less likely, malignancy. The diffuse bladder wall thickening could be due to malignancy, chronic cystitis or chronic bladder outlet obstruction. Correlation with cystoscopy and biopsy is recommended.  Nuclear medicine renal scan on 06/17/16 revealed: Prompt symmetric arterial flow to the kidneys; Left renogram: There is normal time to maximum cortical uptake and normal time to excretion. However, the renal pelvis, calices, and ureter are dilated and only partially washout after Lasix.  Right renogram: There is normal time to maximum cortical uptake and normal time to excretion. However, the renal pelvis, calices, and ureter are dilated and only partially washout after Lasix.  A TURP in Dec 2017 did not show malignancy.  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  He represented with facial swelling and weakness of several days. He had c/o some diff initiating his stream.  He had labs done which were abnormal and he was sent to the [facility] ED for BUN of 110 and creat of 23.9, K was 6.2.  Of note on 07/04/16 BUN was 7 and creat 0.'8mg'$ /dl.  He was evaluated by Dr. Tammi Klippel today and he was unable to cystoscopically place ureteral stents due to obstruction of ureteral orifices from mass effect, and plan is  for bilat PCNs in AM.   Past Medical History:  Diagnosis Date  . Bladder mass 05/2016  . Hydronephrosis, bilateral 05/2016   Past Surgical History:  Procedure Laterality Date  . COLONOSCOPY    . CYSTOSCOPY W/ RETROGRADES Bilateral 07/04/2016   Procedure: CYSTOSCOPY WITH RETROGRADE PYELOGRAM, BLADDER BIOPSY, PROSTATE INCISION;  Surgeon: Kathie Rhodes, MD;  Location: [place];  Service: Urology;  Laterality: Bilateral;  . TRANSURETHRAL RESECTION OF BLADDER TUMOR N/A 07/04/2016   Procedure: TRANSURETHRAL RESECTION OF BLADDER TUMOR (TURBT);  Surgeon: Kathie Rhodes, MD;  Location: [facility];  Service: Urology;  Laterality: N/A;   Social History:  reports that he has never smoked. He has never used smokeless tobacco. He reports that he does not use drugs. His alcohol history is not on file. Allergies: No Known Allergies No family history on file.  Medications:  Prior to Admission:  Prescriptions Prior to Admission  Medication Sig Dispense Refill Last Dose  . HYDROcodone-acetaminophen (NORCO) 10-325 MG tablet Take 1-2 tablets by mouth every 4 (four) hours as needed for moderate pain. Maximum dose per 24 hours - 8 pills (Patient not taking: Reported on 10/14/2016) 30 tablet 0 Not Taking at Unknown time  . phenazopyridine (PYRIDIUM) 200 MG tablet Take 1 tablet (200 mg total) by mouth 3 (three) times daily as needed for pain. (Patient not taking: Reported on 10/14/2016) 30 tablet 0 Not Taking at Unknown time   Scheduled: . sodium polystyrene  45 g Oral Once     ROS: not obtainable Blood pressure (!) 146/78, pulse 86, temperature 97.2 F (36.2 C), resp. rate 14, SpO2 100 %.  General appearance: post op sedation Head: Normocephalic, without obvious abnormality, atraumatic Eyes: negative Resp: clear to auscultation bilaterally Chest wall: no tenderness Cardio: regular rate and rhythm, S1, S2 normal, no murmur, click, rub or gallop GI: soft, non-tender;  bowel sounds normal; no masses,  no organomegaly Extremities: edema tr Skin: Skin color, texture, turgor normal. No rashes or lesions Neurologic: Grossly normal Results for orders placed or performed during the hospital encounter of 10/14/16 (from the past 48 hour(s))  CBC with Differential/Platelet     Status: Abnormal   Collection Time: 10/14/16  1:39 PM  Result Value Ref Range   WBC 5.6 4.0 - 10.5 K/uL   RBC 3.51 (L) 4.22 - 5.81 MIL/uL   Hemoglobin 9.8 (L) 13.0 - 17.0 g/dL   HCT 27.6 (L) 39.0 - 52.0 %   MCV 78.6 78.0 - 100.0 fL   MCH 27.9 26.0 - 34.0 pg   MCHC 35.5 30.0 - 36.0 g/dL   RDW 13.8 11.5 - 15.5 %   Platelets 232 150 - 400 K/uL   Neutrophils Relative % 64 %   Neutro Abs 3.6 1.7 - 7.7 K/uL   Lymphocytes Relative 25 %   Lymphs Abs 1.4 0.7 - 4.0 K/uL   Monocytes Relative 8 %   Monocytes Absolute 0.5 0.1 - 1.0 K/uL   Eosinophils Relative 3 %   Eosinophils Absolute 0.2 0.0 - 0.7 K/uL   Basophils Relative 0 %   Basophils Absolute 0.0 0.0 - 0.1 K/uL  Basic metabolic panel     Status: Abnormal   Collection Time: 10/14/16  1:39 PM  Result Value Ref Range   Sodium 138 135 - 145 mmol/L   Potassium 6.2 (H) 3.5 - 5.1 mmol/L   Chloride 108 101 - 111 mmol/L   CO2 18 (L) 22 - 32 mmol/L   Glucose, Bld 98 65 - 99 mg/dL   BUN 110 (H) 6 - 20 mg/dL    Comment: RESULTS CONFIRMED BY MANUAL DILUTION   Creatinine, Ser 23.96 (H) 0.61 - 1.24 mg/dL   Calcium 9.4 8.9 - 10.3 mg/dL   GFR calc non Af Amer 2 (L) >60 mL/min   GFR calc Af Amer 3 (L) >60 mL/min    Comment: (NOTE) The eGFR has been calculated using the CKD EPI equation. This calculation has not been validated in all clinical situations. eGFR's persistently <60 mL/min signify possible Chronic Kidney Disease.    Anion gap 12 5 - 15  Surgical pcr screen     Status: None   Collection Time: 10/14/16  6:00 PM  Result Value Ref Range   MRSA, PCR NEGATIVE NEGATIVE   Staphylococcus aureus NEGATIVE NEGATIVE    Comment:        The  Xpert SA Assay (FDA approved for NASAL specimens in patients over 11 years of age), is one component of a comprehensive surveillance program.  Test performance has been validated by [facility] for patients greater than or equal to 65 year old. It is not intended to diagnose infection nor to guide or monitor treatment.    [place] Intraoperative  Result Date: 10/14/2016 CLINICAL DATA:  Ultrasound was provided for use by the ordering physician, and a technical charge was applied by the performing facility.  No radiologist interpretation/professional services rendered.   Ct Renal Stone Study  Result Date: 10/14/2016 CLINICAL DATA:  Abdominal pain. History of bladder lesion and hydronephrosis. EXAM: CT ABDOMEN AND PELVIS WITHOUT CONTRAST TECHNIQUE: Multidetector CT imaging of the abdomen and pelvis was performed  following the standard protocol without oral or intravenous contrast material administration. COMPARISON:  June 02, 2016 FINDINGS: Lower chest: Lung bases are clear. Hepatobiliary: No focal liver lesions are appreciable on this noncontrast enhanced study. Gallbladder wall is not appreciably thickened. There is no biliary duct dilatation. Pancreas: No pancreatic mass or inflammatory focus. Spleen: No splenic lesions are evident. Adrenals/Urinary Tract: Adrenals appear unremarkable. There is marked hydronephrosis bilaterally with diffuse ureterectasis to the level of the urinary bladder. There is no renal mass. No calculi noted in either kidney or ureter. The urinary bladder once again shows diffuse wall thickening in a circumferential manner. No well-defined bladder mass is evident. Stomach/Bowel: There is no appreciable bowel wall thickening. There is somewhat generalized mesenteric thickening due to anasarca. There is no bowel obstruction. No free air or portal venous air. Vascular/Lymphatic: No abdominal aortic aneurysm. No vascular lesions are evident. There is no adenopathy in the abdomen  or pelvis. Reproductive: Prostate is enlarged for age. Seminal vesicles appear unremarkable. Other: Appendix appears unremarkable. No abscess or ascites evident. Musculoskeletal: There are no blastic or lytic bone lesions. There is generalized anasarca. There are no intramuscular lesions. IMPRESSION: Generalized anasarca, likely due to chronic renal obstruction bilaterally. There is again noted severe hydronephrosis and ureterectasis bilaterally with diffuse urinary bladder wall thickening, likely impressing on the respective ureterovesical junction stone causing hydronephrosis in ureterectasis due to the abnormality in the urinary bladder. There is no well-defined urinary bladder mass. The appearance raises question of a chronic inflammatory type lesion causing the generalized bladder wall thickening. It should be noted that neoplastic etiology cannot be excluded. Prostate is enlarged for age. The significance of this finding in light of the other findings is uncertain. There may be a degree of chronic bladder outlet obstruction due to the prostate enlargement. Urologic assessment is advised given these findings. Note that there are no renal or ureteral calculi. Note that the appearance of the prostate, bladder, and kidneys is similar to prior study. No bowel obstruction.  Appendix appears normal.  No adenopathy. Electronically Signed   By: Lowella Grip III M.D.   On: 10/14/2016 15:10    Assessment:  1 AKI due to obstructive uropathy due to bladder/prostate mass 2 Hyperkalemia due to #1 3 Anemia of kidney dz 4 Chronic bilateral hydronephrosis 5 Bladder/prostate mass of unknown cause  Plan: 1 Kaexylate 2 PCNs in AM and pray for improvement in renal function   Garfield Coiner C 10/14/2016, 8:54 PM

## 2016-10-14 NOTE — ED Provider Notes (Addendum)
[facility]-EMERGENCY DEPT Provider Note   CSN: [id number] Arrival date & time: 10/14/16  1210     History   Chief Complaint Chief Complaint  Patient presents with  . Abnormal Lab    HPI Alexander Sims is a 21 y.o. male.  21 year old male presents with facial swelling times several days along with weakness. Has a history of a bladder mass and was seen by his urologist yesterday and have blood work performed. Was called today after he had elevated creatinine and was sent here for further evaluation. Patient states that he has had trouble with starting a stream of urine but is since improved. Denies any flank pain. No fever or chills. No nausea vomiting. No treatment use prior to arrival.      Past Medical History:  Diagnosis Date  . Bladder mass 05/2016  . Hydronephrosis, bilateral 05/2016    There are no active problems to display for this patient.   Past Surgical History:  Procedure Laterality Date  . COLONOSCOPY    . CYSTOSCOPY W/ RETROGRADES Bilateral 07/04/2016   Procedure: CYSTOSCOPY WITH RETROGRADE PYELOGRAM, BLADDER BIOPSY, PROSTATE INCISION;  Surgeon: Ihor Gully, MD;  Location: [facility];  Service: Urology;  Laterality: Bilateral;  . TRANSURETHRAL RESECTION OF BLADDER TUMOR N/A 07/04/2016   Procedure: TRANSURETHRAL RESECTION OF BLADDER TUMOR (TURBT);  Surgeon: Ihor Gully, MD;  Location: [facility];  Service: Urology;  Laterality: N/A;       Home Medications    Prior to Admission medications   Medication Sig Start Date End Date Taking? Authorizing Provider  HYDROcodone-acetaminophen (NORCO) 10-325 MG tablet Take 1-2 tablets by mouth every 4 (four) hours as needed for moderate pain. Maximum dose per 24 hours - 8 pills Patient not taking: Reported on 10/14/2016 07/04/16   Ihor Gully, MD  phenazopyridine (PYRIDIUM) 200 MG tablet Take 1 tablet (200 mg total) by mouth 3 (three) times daily as needed for pain. Patient not taking:  Reported on 10/14/2016 07/04/16   Ihor Gully, MD    Family History No family history on file.  Social History Social History  Substance Use Topics  . Smoking status: Never Smoker  . Smokeless tobacco: Never Used  . Alcohol use Not on file     Allergies   Patient has no known allergies.   Review of Systems Review of Systems  All other systems reviewed and are negative.    Physical Exam Updated Vital Signs BP (!) 163/74 (BP Location: Left Arm)   Pulse 81   Temp 98.9 F (37.2 C) (Oral)   Resp 18   SpO2 100%   Physical Exam  Constitutional: He is oriented to person, place, and time. He appears well-developed and well-nourished.  Non-toxic appearance. No distress.  HENT:  Head: Normocephalic and atraumatic.  Some facial edema appreciated.  Eyes: Conjunctivae, EOM and lids are normal. Pupils are equal, round, and reactive to light.  Neck: Normal range of motion. Neck supple. No tracheal deviation present. No thyroid mass present.  Cardiovascular: Normal rate, regular rhythm and normal heart sounds.  Exam reveals no gallop.   No murmur heard. Pulmonary/Chest: Effort normal and breath sounds normal. No stridor. No respiratory distress. He has no decreased breath sounds. He has no wheezes. He has no rhonchi. He has no rales.  Abdominal: Soft. Normal appearance and bowel sounds are normal. He exhibits no distension. There is no tenderness. There is no rebound and no CVA tenderness.  Musculoskeletal: Normal range of motion. He exhibits no edema  or tenderness.  Neurological: He is alert and oriented to person, place, and time. He has normal strength. No cranial nerve deficit or sensory deficit. GCS eye subscore is 4. GCS verbal subscore is 5. GCS motor subscore is 6.  Skin: Skin is warm and dry. No abrasion and no rash noted.  Psychiatric: He has a normal mood and affect. His speech is normal and behavior is normal.  Nursing note and vitals reviewed.    ED Treatments /  Results  Labs (all labs ordered are listed, but only abnormal results are displayed) Labs Reviewed  CBC WITH DIFFERENTIAL/PLATELET - Abnormal; Notable for the following:       Result Value   RBC 3.51 (*)    Hemoglobin 9.8 (*)    HCT 27.6 (*)    All other components within normal limits  BASIC METABOLIC PANEL - Abnormal; Notable for the following:    Potassium 6.2 (*)    CO2 18 (*)    BUN 110 (*)    Creatinine, Ser 23.96 (*)    GFR calc non Af Amer 2 (*)    GFR calc Af Amer 3 (*)    All other components within normal limits    EKG  EKG Interpretation  Date/Time:  Friday October 14 2016 15:40:12 EDT Ventricular Rate:  76 PR Interval:    QRS Duration: 87 QT Interval:  366 QTC Calculation: 412 R Axis:   9 Text Interpretation:  Sinus rhythm Confirmed by Freida Busman  MD, Trey Bebee ([id number]) on 10/14/2016 3:42:27 PM       Radiology Ct Renal Stone Study  Result Date: 10/14/2016 CLINICAL DATA:  Abdominal pain. History of bladder lesion and hydronephrosis. EXAM: CT ABDOMEN AND PELVIS WITHOUT CONTRAST TECHNIQUE: Multidetector CT imaging of the abdomen and pelvis was performed following the standard protocol without oral or intravenous contrast material administration. COMPARISON:  June 02, 2016 FINDINGS: Lower chest: Lung bases are clear. Hepatobiliary: No focal liver lesions are appreciable on this noncontrast enhanced study. Gallbladder wall is not appreciably thickened. There is no biliary duct dilatation. Pancreas: No pancreatic mass or inflammatory focus. Spleen: No splenic lesions are evident. Adrenals/Urinary Tract: Adrenals appear unremarkable. There is marked hydronephrosis bilaterally with diffuse ureterectasis to the level of the urinary bladder. There is no renal mass. No calculi noted in either kidney or ureter. The urinary bladder once again shows diffuse wall thickening in a circumferential manner. No well-defined bladder mass is evident. Stomach/Bowel: There is no appreciable bowel  wall thickening. There is somewhat generalized mesenteric thickening due to anasarca. There is no bowel obstruction. No free air or portal venous air. Vascular/Lymphatic: No abdominal aortic aneurysm. No vascular lesions are evident. There is no adenopathy in the abdomen or pelvis. Reproductive: Prostate is enlarged for age. Seminal vesicles appear unremarkable. Other: Appendix appears unremarkable. No abscess or ascites evident. Musculoskeletal: There are no blastic or lytic bone lesions. There is generalized anasarca. There are no intramuscular lesions. IMPRESSION: Generalized anasarca, likely due to chronic renal obstruction bilaterally. There is again noted severe hydronephrosis and ureterectasis bilaterally with diffuse urinary bladder wall thickening, likely impressing on the respective ureterovesical junction stone causing hydronephrosis in ureterectasis due to the abnormality in the urinary bladder. There is no well-defined urinary bladder mass. The appearance raises question of a chronic inflammatory type lesion causing the generalized bladder wall thickening. It should be noted that neoplastic etiology cannot be excluded. Prostate is enlarged for age. The significance of this finding in light of the other findings is uncertain.  There may be a degree of chronic bladder outlet obstruction due to the prostate enlargement. Urologic assessment is advised given these findings. Note that there are no renal or ureteral calculi. Note that the appearance of the prostate, bladder, and kidneys is similar to prior study. No bowel obstruction.  Appendix appears normal.  No adenopathy. Electronically Signed   By: Bretta Bang III M.D.   On: 10/14/2016 15:10    Procedures Procedures (including critical care time)  Medications Ordered in ED Medications  0.9 %  sodium chloride infusion (not administered)  insulin aspart (novoLOG) injection 10 Units (not administered)  dextrose 50 % solution 50 mL (not  administered)  calcium gluconate 1 g in sodium chloride 0.9 % 100 mL IVPB (not administered)     Initial Impression / Assessment and Plan / ED Course  I have reviewed the triage vital signs and the nursing notes.  Pertinent labs & imaging results that were available during my care of the patient were reviewed by me and considered in my medical decision making (see chart for details).     Patient evidence of hyperkalemia was treated with insulin, glucose, calcium. Will discuss case with urologist on call who will likely come to admit the patient.   CRITICAL CARE Performed by: Toy Baker Total critical care time: 45 minutes Critical care time was exclusive of separately billable procedures and treating other patients. Critical care was necessary to treat or prevent imminent or life-threatening deterioration. Critical care was time spent personally by me on the following activities: development of treatment plan with patient and/or surrogate as well as nursing, discussions with consultants, evaluation of patient's response to treatment, examination of patient, obtaining history from patient or surrogate, ordering and performing treatments and interventions, ordering and review of laboratory studies, ordering and review of radiographic studies, pulse oximetry and re-evaluation of patient's condition.   Final Clinical Impressions(s) / ED Diagnoses   Final diagnoses:  None    New Prescriptions New Prescriptions   No medications on file     Lorre Nick, MD 10/14/16 1516    Lorre Nick, MD 10/14/16 1533    Lorre Nick, MD 10/14/16 [phone]

## 2016-10-14 NOTE — Transfer of Care (Signed)
Immediate Anesthesia Transfer of Care Note  Patient: Alexander Sims  Procedure(s) Performed: Procedure(s): CYSTOSCOPY AND BLADDER BIOPSY WITH FULGERATION (Bilateral) TRANSRECTAL ULTRASOUND FOR PROSTATE BIOPSY (N/A)  Patient Location: PACU  Anesthesia Type:General  Level of Consciousness: sedated, patient cooperative and responds to stimulation  Airway & Oxygen Therapy: Patient Spontanous Breathing and Patient connected to face mask oxygen  Post-op Assessment: Report given to RN and Post -op Vital signs reviewed and stable  Post vital signs: Reviewed and stable  Last Vitals:  Vitals:   10/14/16 1221 10/14/16 1600  BP: (!) 163/74 (!) 159/98  Pulse: 81 72  Resp: 18 15  Temp: 37.2 C     Last Pain:  Vitals:   10/14/16 1221  TempSrc: Oral         Complications: No apparent anesthesia complications

## 2016-10-14 NOTE — ED Triage Notes (Signed)
Pt states he was sent by PCP due to labs showing a problem with his kidneys. Pt states he has had swelling in his face and knees for the past 2 days.

## 2016-10-14 NOTE — ED Notes (Signed)
Patient has 3 bags of belongings (1 large black duffel, 1 black backpack, and 1 hospital patient belongings bag) are now stored in ED belongings cabinet. Told that security lockers are not large enough for bags. Will store in ED and can be moved to floor when patient is assigned room. OR not able to take.

## 2016-10-14 NOTE — Anesthesia Preprocedure Evaluation (Signed)
Anesthesia Evaluation  Patient identified by MRN, date of birth, ID bandGeneral Assessment Comment:Sleepy but alert when engaged  Reviewed: Allergy & Precautions, NPO status , Patient's Chart, lab work & pertinent test results  Airway Mallampati: I  TM Distance: >3 FB Neck ROM: Full    Dental  (+) Teeth Intact   Pulmonary neg pulmonary ROS,    breath sounds clear to auscultation- rhonchi       Cardiovascular negative cardio ROS   Rhythm:Regular     Neuro/Psych negative neurological ROS  negative psych ROS   GI/Hepatic negative GI ROS, Neg liver ROS,   Endo/Other  negative endocrine ROS  Renal/GU ARFRenal diseaseARF Cr > 20 with bladder mass  negative genitourinary   Musculoskeletal negative musculoskeletal ROS (+)   Abdominal   Peds negative pediatric ROS (+)  Hematology negative hematology ROS (+)   Anesthesia Other Findings   Reproductive/Obstetrics negative OB ROS                             Anesthesia Physical Anesthesia Plan  ASA: III  Anesthesia Plan: General   Post-op Pain Management:    Induction: Intravenous  Airway Management Planned: LMA  Additional Equipment: None  Intra-op Plan:   Post-operative Plan: Extubation in OR  Informed Consent: I have reviewed the patients History and Physical, chart, labs and discussed the procedure including the risks, benefits and alternatives for the proposed anesthesia with the patient or authorized representative who has indicated his/her understanding and acceptance.   Dental advisory given  Plan Discussed with: CRNA and Surgeon  Anesthesia Plan Comments:         Anesthesia Quick Evaluation

## 2016-10-14 NOTE — H&P (Signed)
Alexander Sims is an 21 y.o. male.    Chief Complaint: Acute on Chronic Renal Failure, Severe Bilateral Hydronephrosis, Bladder-Prostate Thickening / Rule Out Neoplasm  HPI:   1 - Acute on Chronic Renal Failure - Cr >20 with BUN >100 by office labs yesterday and confirmed on ER labs today. Mild hyperkalemia to 6.2. Baseline Cr <1.5.   Denies new nephrotoxin exposure / dehydration.   2 -  Severe Bilateral Hydronephrosis - worsening bilateral hydro to bladder by ER CT 10/14/16.   3- Bladder-Prostate Thickening / Rule Out Neoplasm - s/p TUR bladder neck 06/2016, path at that time favor benign changes. Prior contrast CT with abnormal thickening and enhancement of prostate-bladder neck. PSA normal (>0.6).   PMH otherwise unremarkable. NO CV disease. NO chest / abd surgeries.  Today "Alexander Sims" is seen for urgent evaluation of above. Last meal yesterday.    Past Medical History:  Diagnosis Date  . Bladder mass 05/2016  . Hydronephrosis, bilateral 05/2016    Past Surgical History:  Procedure Laterality Date  . COLONOSCOPY    . CYSTOSCOPY W/ RETROGRADES Bilateral 07/04/2016   Procedure: CYSTOSCOPY WITH RETROGRADE PYELOGRAM, BLADDER BIOPSY, PROSTATE INCISION;  Surgeon: Kathie Rhodes, MD;  Location: [facility];  Service: Urology;  Laterality: Bilateral;  . TRANSURETHRAL RESECTION OF BLADDER TUMOR N/A 07/04/2016   Procedure: TRANSURETHRAL RESECTION OF BLADDER TUMOR (TURBT);  Surgeon: Kathie Rhodes, MD;  Location: [facility];  Service: Urology;  Laterality: N/A;    No family history on file. Social History:  reports that he has never smoked. He has never used smokeless tobacco. He reports that he does not use drugs. His alcohol history is not on file.  Allergies: No Known Allergies   (Not in a hospital admission)  Results for orders placed or performed during the hospital encounter of 10/14/16 (from the past 48 hour(s))  CBC with Differential/Platelet     Status:  Abnormal   Collection Time: 10/14/16  1:39 PM  Result Value Ref Range   WBC 5.6 4.0 - 10.5 K/uL   RBC 3.51 (L) 4.22 - 5.81 MIL/uL   Hemoglobin 9.8 (L) 13.0 - 17.0 g/dL   HCT 27.6 (L) 39.0 - 52.0 %   MCV 78.6 78.0 - 100.0 fL   MCH 27.9 26.0 - 34.0 pg   MCHC 35.5 30.0 - 36.0 g/dL   RDW 13.8 11.5 - 15.5 %   Platelets 232 150 - 400 K/uL   Neutrophils Relative % 64 %   Neutro Abs 3.6 1.7 - 7.7 K/uL   Lymphocytes Relative 25 %   Lymphs Abs 1.4 0.7 - 4.0 K/uL   Monocytes Relative 8 %   Monocytes Absolute 0.5 0.1 - 1.0 K/uL   Eosinophils Relative 3 %   Eosinophils Absolute 0.2 0.0 - 0.7 K/uL   Basophils Relative 0 %   Basophils Absolute 0.0 0.0 - 0.1 K/uL  Basic metabolic panel     Status: Abnormal   Collection Time: 10/14/16  1:39 PM  Result Value Ref Range   Sodium 138 135 - 145 mmol/L   Potassium 6.2 (H) 3.5 - 5.1 mmol/L   Chloride 108 101 - 111 mmol/L   CO2 18 (L) 22 - 32 mmol/L   Glucose, Bld 98 65 - 99 mg/dL   BUN 110 (H) 6 - 20 mg/dL    Comment: RESULTS CONFIRMED BY MANUAL DILUTION   Creatinine, Ser 23.96 (H) 0.61 - 1.24 mg/dL   Calcium 9.4 8.9 - 10.3 mg/dL   GFR  calc non Af Amer 2 (L) >60 mL/min   GFR calc Af Amer 3 (L) >60 mL/min    Comment: (NOTE) The eGFR has been calculated using the CKD EPI equation. This calculation has not been validated in all clinical situations. eGFR's persistently <60 mL/min signify possible Chronic Kidney Disease.    Anion gap 12 5 - 15   Ct Renal Stone Study  Result Date: 10/14/2016 CLINICAL DATA:  Abdominal pain. History of bladder lesion and hydronephrosis. EXAM: CT ABDOMEN AND PELVIS WITHOUT CONTRAST TECHNIQUE: Multidetector CT imaging of the abdomen and pelvis was performed following the standard protocol without oral or intravenous contrast material administration. COMPARISON:  June 02, 2016 FINDINGS: Lower chest: Lung bases are clear. Hepatobiliary: No focal liver lesions are appreciable on this noncontrast enhanced study.  Gallbladder wall is not appreciably thickened. There is no biliary duct dilatation. Pancreas: No pancreatic mass or inflammatory focus. Spleen: No splenic lesions are evident. Adrenals/Urinary Tract: Adrenals appear unremarkable. There is marked hydronephrosis bilaterally with diffuse ureterectasis to the level of the urinary bladder. There is no renal mass. No calculi noted in either kidney or ureter. The urinary bladder once again shows diffuse wall thickening in a circumferential manner. No well-defined bladder mass is evident. Stomach/Bowel: There is no appreciable bowel wall thickening. There is somewhat generalized mesenteric thickening due to anasarca. There is no bowel obstruction. No free air or portal venous air. Vascular/Lymphatic: No abdominal aortic aneurysm. No vascular lesions are evident. There is no adenopathy in the abdomen or pelvis. Reproductive: Prostate is enlarged for age. Seminal vesicles appear unremarkable. Other: Appendix appears unremarkable. No abscess or ascites evident. Musculoskeletal: There are no blastic or lytic bone lesions. There is generalized anasarca. There are no intramuscular lesions. IMPRESSION: Generalized anasarca, likely due to chronic renal obstruction bilaterally. There is again noted severe hydronephrosis and ureterectasis bilaterally with diffuse urinary bladder wall thickening, likely impressing on the respective ureterovesical junction stone causing hydronephrosis in ureterectasis due to the abnormality in the urinary bladder. There is no well-defined urinary bladder mass. The appearance raises question of a chronic inflammatory type lesion causing the generalized bladder wall thickening. It should be noted that neoplastic etiology cannot be excluded. Prostate is enlarged for age. The significance of this finding in light of the other findings is uncertain. There may be a degree of chronic bladder outlet obstruction due to the prostate enlargement. Urologic  assessment is advised given these findings. Note that there are no renal or ureteral calculi. Note that the appearance of the prostate, bladder, and kidneys is similar to prior study. No bowel obstruction.  Appendix appears normal.  No adenopathy. Electronically Signed   By: Lowella Grip III M.D.   On: 10/14/2016 15:10    Review of Systems  Constitutional: Positive for malaise/fatigue. Negative for chills and fever.  Eyes: Negative.   Respiratory: Negative.   Cardiovascular: Negative.  Negative for chest pain and palpitations.  Gastrointestinal: Negative.  Negative for nausea and vomiting.  Genitourinary: Negative.   Musculoskeletal: Negative.   Skin: Negative.   Neurological: Negative.   Endo/Heme/Allergies: Negative.   Psychiatric/Behavioral: Negative.     Blood pressure (!) 163/74, pulse 81, temperature 98.9 F (37.2 C), temperature source Oral, resp. rate 18, SpO2 100 %. Physical Exam  Constitutional: He appears well-developed.  HENT:  Head: Normocephalic.  Eyes: Pupils are equal, round, and reactive to light.  Neck: Normal range of motion.  Cardiovascular: Normal rate.   Respiratory: Effort normal.  GI: Soft.  Genitourinary:  Genitourinary  Comments: No CVAT  Musculoskeletal: Normal range of motion.  Neurological: He is alert.  Skin: Skin is warm.  Psychiatric: He has a normal mood and affect. His behavior is normal.     Assessment/Plan  1 - Acute on Chronic Renal Failure - This appears to be acute worsening of obstructive uropathy. He needs urgent renal decompression. Options of neph tubes (most definitive) v. Attempt stent discussed, he wants attempt stent. We both agree that if not succesfull neph tubes warranted.   2 -  Severe Bilateral Hydronephrosis - as per above.   3- Bladder-Prostate Thickening / Rule Out Neoplasm - Will also perform repeat bladder BX today as well as dedicated prostate [place] and biopsy  / exam under anesthesia with goal of maximally ruling  out neoplasm.   Risks, benefits, alternatives, expected peri-op course, need for post-op admission with serial labs discussed.   Alexis Frock, MD 10/14/2016, 3:46 PM

## 2016-10-14 NOTE — Anesthesia Procedure Notes (Signed)
Procedure Name: LMA Insertion Date/Time: 10/14/2016 6:17 PM Performed by: Epimenio Sarin Pre-anesthesia Checklist: Patient identified, Emergency Drugs available, Suction available, Patient being monitored and Timeout performed Patient Re-evaluated:Patient Re-evaluated prior to inductionOxygen Delivery Method: Circle system utilized Preoxygenation: Pre-oxygenation with 100% oxygen Intubation Type: IV induction LMA: LMA with gastric port inserted LMA Size: 4.0 Number of attempts: 1 Dental Injury: Teeth and Oropharynx as per pre-operative assessment

## 2016-10-14 NOTE — Brief Op Note (Signed)
10/14/2016  7:18 PM  PATIENT:  Alexander Sims  21 y.o. male  PRE-OPERATIVE DIAGNOSIS:  acute renal failure with hydronephrosis, possible bladder-prostate mass  POST-OPERATIVE DIAGNOSIS:  acute renal failure with hydronephrosis, possible bladder-prostate mass  PROCEDURE:  Procedure(s): CYSTOSCOPY AND BLADDER BIOPSY WILTH FULGERATION (Bilateral) TRANSRECTAL ULTRASOUND PROSTATE BIOPSY (N/A)  SURGEON:  Surgeon(s) and Role:    * Sebastian Ache, MD - Primary  PHYSICIAN ASSISTANT:   ASSISTANTS: none   ANESTHESIA:   general  EBL:  No intake/output data recorded.  BLOOD ADMINISTERED:none  DRAINS: 70F 3 way foley to NS irrigation   LOCAL MEDICATIONS USED:  NONE  SPECIMEN:  Source of Specimen:  1 - abnormal bladder neck-prostate tissue; 2- prostate biopsy  DISPOSITION OF SPECIMEN:  PATHOLOGY  COUNTS:  YES  TOURNIQUET:  * No tourniquets in log *  DICTATION: .Other Dictation: Dictation Number [id number]  PLAN OF CARE: Admit to inpatient   PATIENT DISPOSITION:  PACU - hemodynamically stable.   Delay start of Pharmacological VTE agent (>24hrs) due to surgical blood loss or risk of bleeding: yes

## 2016-10-15 ENCOUNTER — Inpatient Hospital Stay (HOSPITAL_COMMUNITY): Payer: BLUE CROSS/BLUE SHIELD

## 2016-10-15 ENCOUNTER — Encounter (HOSPITAL_COMMUNITY): Payer: Self-pay | Admitting: Radiology

## 2016-10-15 HISTORY — PX: IR GENERIC HISTORICAL: IMG1180011

## 2016-10-15 LAB — PROTIME-INR
INR: 1.24
Prothrombin Time: 15.6 seconds — ABNORMAL HIGH (ref 11.4–15.2)

## 2016-10-15 LAB — RENAL FUNCTION PANEL
ANION GAP: 13 (ref 5–15)
Albumin: 3.3 g/dL — ABNORMAL LOW (ref 3.5–5.0)
BUN: 114 mg/dL — ABNORMAL HIGH (ref 6–20)
CALCIUM: 9 mg/dL (ref 8.9–10.3)
CHLORIDE: 110 mmol/L (ref 101–111)
CO2: 15 mmol/L — ABNORMAL LOW (ref 22–32)
CREATININE: 24 mg/dL — AB (ref 0.61–1.24)
GFR calc non Af Amer: 2 mL/min — ABNORMAL LOW (ref >60)
GFR, EST AFRICAN AMERICAN: 3 mL/min — AB (ref >60)
Glucose, Bld: 128 mg/dL — ABNORMAL HIGH (ref 65–99)
Phosphorus: 5.5 mg/dL — ABNORMAL HIGH (ref 2.5–4.6)
Potassium: 7.5 mmol/L (ref 3.5–5.1)
Sodium: 138 mmol/L (ref 135–145)

## 2016-10-15 LAB — BASIC METABOLIC PANEL
Anion gap: 11 (ref 5–15)
BUN: 116 mg/dL — AB (ref 6–20)
CALCIUM: 9.5 mg/dL (ref 8.9–10.3)
CO2: 18 mmol/L — ABNORMAL LOW (ref 22–32)
CREATININE: 22.39 mg/dL — AB (ref 0.61–1.24)
Chloride: 111 mmol/L (ref 101–111)
GFR calc Af Amer: 3 mL/min — ABNORMAL LOW (ref >60)
GFR, EST NON AFRICAN AMERICAN: 2 mL/min — AB (ref >60)
GLUCOSE: 112 mg/dL — AB (ref 65–99)
POTASSIUM: 6.3 mmol/L — AB (ref 3.5–5.1)
SODIUM: 140 mmol/L (ref 135–145)

## 2016-10-15 LAB — APTT: APTT: 29 s (ref 24–36)

## 2016-10-15 MED ORDER — STERILE WATER FOR INJECTION IV SOLN
INTRAVENOUS | Status: DC
  Filled 2016-10-15: qty 850

## 2016-10-15 MED ORDER — LIDOCAINE HCL 1 % IJ SOLN
INTRAMUSCULAR | Status: AC
  Filled 2016-10-15: qty 20

## 2016-10-15 MED ORDER — SODIUM POLYSTYRENE SULFONATE 15 GM/60ML PO SUSP
30.0000 g | Freq: Once | ORAL | Status: AC
  Administered 2016-10-15: 30 g via ORAL
  Filled 2016-10-15: qty 120

## 2016-10-15 MED ORDER — IOPAMIDOL (ISOVUE-300) INJECTION 61%
25.0000 mL | Freq: Once | INTRAVENOUS | Status: AC | PRN
  Administered 2016-10-15: 25 mL

## 2016-10-15 MED ORDER — SODIUM POLYSTYRENE SULFONATE 15 GM/60ML PO SUSP: 45.0000 g | Freq: Once | ORAL | Status: DC

## 2016-10-15 MED ORDER — FENTANYL CITRATE (PF) 100 MCG/2ML IJ SOLN
INTRAMUSCULAR | Status: AC
  Filled 2016-10-15: qty 4

## 2016-10-15 MED ORDER — CIPROFLOXACIN IN D5W 400 MG/200ML IV SOLN
400.0000 mg | INTRAVENOUS | Status: AC
  Administered 2016-10-15: 400 mg via INTRAVENOUS

## 2016-10-15 MED ORDER — FENTANYL CITRATE (PF) 100 MCG/2ML IJ SOLN
INTRAMUSCULAR | Status: AC | PRN
  Administered 2016-10-15: 50 ug via INTRAVENOUS
  Administered 2016-10-15: 25 ug via INTRAVENOUS

## 2016-10-15 MED ORDER — IOPAMIDOL (ISOVUE-300) INJECTION 61%
INTRAVENOUS | Status: AC
  Administered 2016-10-15: 25 mL
  Filled 2016-10-15: qty 50

## 2016-10-15 MED ORDER — LIDOCAINE HCL 1 % IJ SOLN
INTRAMUSCULAR | Status: AC | PRN
  Administered 2016-10-15: 10 mL
  Administered 2016-10-15: 20 mL
  Administered 2016-10-17: 5 mL via INTRADERMAL

## 2016-10-15 MED ORDER — SODIUM POLYSTYRENE SULFONATE 15 GM/60ML PO SUSP
45.0000 g | ORAL | Status: AC
  Administered 2016-10-15: 45 g via ORAL
  Filled 2016-10-15: qty 180

## 2016-10-15 MED ORDER — SODIUM BICARBONATE 8.4 % IV SOLN
INTRAVENOUS | Status: DC
  Administered 2016-10-15: 14:00:00 via INTRAVENOUS
  Filled 2016-10-15 (×2): qty 150

## 2016-10-15 MED ORDER — MIDAZOLAM HCL 2 MG/2ML IJ SOLN
INTRAMUSCULAR | Status: AC | PRN
  Administered 2016-10-15 (×4): 1 mg via INTRAVENOUS

## 2016-10-15 MED ORDER — CIPROFLOXACIN IN D5W 400 MG/200ML IV SOLN
INTRAVENOUS | Status: AC
  Filled 2016-10-15: qty 200

## 2016-10-15 MED ORDER — MIDAZOLAM HCL 2 MG/2ML IJ SOLN
INTRAMUSCULAR | Status: AC
  Filled 2016-10-15: qty 6

## 2016-10-15 NOTE — Progress Notes (Signed)
Assessment: 1 - Acute on Chronic Renal Failure - Cr. today remains elevated at 24. His potassium has increased to 7.5. He is scheduled for bilateral percutaneous nephrostomy tube placement today.   2 -  Severe Bilateral Hydronephrosis - this is due to some form of disease process of the bladder and likely prostate is well that has been biopsied previously and found to be benign but appears to have progressed to result in significant bilateral ureteral obstruction at the level of the bladder.  3- Bladder-Prostate Thickening / Rule Out Neoplasm - s/p TUR bladder neck 06/2016, path at that time favor benign changes. On 10/14/16 he underwent repeat cystoscopy with findings similar to those in 12/17. Further bladder neck tissue was resected and prostatic tissue was obtained with transurethral incision of the prostate. The ureteral orifices could not be identified for retrograde stent placement. Transrectal prostate biopsies were obtained as well in order to maximize tissue for pathologic evaluation. His Foley catheter is draining and his urine is slightly red but has no clots.  Plan: 1. Kayexalate 2. Recheck potassium later this afternoon. 3. Bilateral nephrostomy tube placement today. 4. Continue Foley catheter drainage for now. 5. Await pathology results.     Subjective: Patient reports   Objective: Vital signs in last 24 hours: Temp:  [97.1 F (36.2 Sims)-99.4 F (37.4 Sims)] 99.4 F (37.4 Sims) (03/31 0604) Pulse Rate:  [72-103] 99 (03/31 0604) Resp:  [12-23] 16 (03/31 0604) BP: (132-166)/(61-98) 149/61 (03/31 0604) SpO2:  [99 %-100 %] 100 % (03/31 0604)A  Intake/Output from previous day: 03/30 0701 - 03/31 0700 In: 1100 [I.V.:1100] Out: 4350 [Urine:4350] Intake/Output this shift: No intake/output data recorded.  Past Medical History:  Diagnosis Date  . Bladder mass 05/2016  . Hydronephrosis, bilateral 05/2016    Physical Exam:  Gen. - Awake, alert and in no distress Lungs -  Normal respiratory effort, chest expands symmetrically.  Abdomen - Soft, non-tender & non-distended. Foley catheter draining light red urine without clots.  Lab Results:  Recent Labs  10/14/16 1339  WBC 5.6  HGB 9.8*  HCT 27.6*   BMET  Recent Labs  10/14/16 1951 10/15/16 0649  NA 140 138  K 4.6 7.5*  CL 109 110  CO2 16* 15*  GLUCOSE 46* 128*  BUN 97* 114*  CREATININE 24.74* 24.00*  CALCIUM 9.6 9.0   No results for input(s): LABURIN in the last 72 hours. Results for orders placed or performed during the hospital encounter of 10/14/16  Surgical pcr screen     Status: None   Collection Time: 10/14/16  6:00 PM  Result Value Ref Range Status   MRSA, PCR NEGATIVE NEGATIVE Final   Staphylococcus aureus NEGATIVE NEGATIVE Final    Comment:        The Xpert SA Assay (FDA approved for NASAL specimens in patients over 83 years of age), is one component of a comprehensive surveillance program.  Test performance has been validated by [facility] for patients greater than or equal to 91 year old. It is not intended to diagnose infection nor to guide or monitor treatment.     Studies/Results: [place] Intraoperative  Result Date: 10/14/2016 CLINICAL DATA:  Ultrasound was provided for use by the ordering physician, and a technical charge was applied by the performing facility.  No radiologist interpretation/professional services rendered.   Ct Renal Stone Study  Result Date: 10/14/2016 CLINICAL DATA:  Abdominal pain. History of bladder lesion and hydronephrosis. EXAM: CT ABDOMEN AND PELVIS WITHOUT CONTRAST TECHNIQUE: Multidetector CT  imaging of the abdomen and pelvis was performed following the standard protocol without oral or intravenous contrast material administration. COMPARISON:  June 02, 2016 FINDINGS: Lower chest: Lung bases are clear. Hepatobiliary: No focal liver lesions are appreciable on this noncontrast enhanced study. Gallbladder wall is not appreciably thickened.  There is no biliary duct dilatation. Pancreas: No pancreatic mass or inflammatory focus. Spleen: No splenic lesions are evident. Adrenals/Urinary Tract: Adrenals appear unremarkable. There is marked hydronephrosis bilaterally with diffuse ureterectasis to the level of the urinary bladder. There is no renal mass. No calculi noted in either kidney or ureter. The urinary bladder once again shows diffuse wall thickening in a circumferential manner. No well-defined bladder mass is evident. Stomach/Bowel: There is no appreciable bowel wall thickening. There is somewhat generalized mesenteric thickening due to anasarca. There is no bowel obstruction. No free air or portal venous air. Vascular/Lymphatic: No abdominal aortic aneurysm. No vascular lesions are evident. There is no adenopathy in the abdomen or pelvis. Reproductive: Prostate is enlarged for age. Seminal vesicles appear unremarkable. Other: Appendix appears unremarkable. No abscess or ascites evident. Musculoskeletal: There are no blastic or lytic bone lesions. There is generalized anasarca. There are no intramuscular lesions. IMPRESSION: Generalized anasarca, likely due to chronic renal obstruction bilaterally. There is again noted severe hydronephrosis and ureterectasis bilaterally with diffuse urinary bladder wall thickening, likely impressing on the respective ureterovesical junction stone causing hydronephrosis in ureterectasis due to the abnormality in the urinary bladder. There is no well-defined urinary bladder mass. The appearance raises question of a chronic inflammatory type lesion causing the generalized bladder wall thickening. It should be noted that neoplastic etiology cannot be excluded. Prostate is enlarged for age. The significance of this finding in light of the other findings is uncertain. There may be a degree of chronic bladder outlet obstruction due to the prostate enlargement. Urologic assessment is advised given these findings. Note that  there are no renal or ureteral calculi. Note that the appearance of the prostate, bladder, and kidneys is similar to prior study. No bowel obstruction.  Appendix appears normal.  No adenopathy. Electronically Signed   By: Bretta Bang III M.D.   On: 10/14/2016 15:10      Alexander Sims 10/15/2016, 10:05 AM

## 2016-10-15 NOTE — Consult Note (Signed)
Chief Complaint: Patient was seen in consultation today for bilateral percutaneous nephrostomy placement Chief Complaint  Patient presents with  . Abnormal Lab    hypercalcemia   at the request of Dr Ezzard Flax  Referring Physician(s): Dr Ezzard Flax  Supervising Physician: Richarda Overlie  Patient Status: [name] - In-pt  History of Present Illness: Alexander Sims is a 21 y.o. male   Bilateral hydronephrosis Bladder mass Unsuccessful attempt at urerteral stent with Dr Berneice Heinrich yesterday Did get biopsy of bladder and prostate  Hx 40 lb wt loss last year. Was worked up then for wt loss and diarrhea CT revealed abnormal bladder wall thickening and enlarged prostate TURP 06/2016 was neg for malignancy  Admitted from ED yesterday with facial swelling; weakness Found acute on chronic renal failure Today Cr 24; BUN 119 B hydronephrosis per imaging Request for bilateral percutaneous nephrostomy placement Imaging reviewed and approved with Dr Deanne Coffer and Dr Lowella Dandy    Past Medical History:  Diagnosis Date  . Bladder mass 05/2016  . Hydronephrosis, bilateral 05/2016    Past Surgical History:  Procedure Laterality Date  . COLONOSCOPY    . CYSTOSCOPY W/ RETROGRADES Bilateral 07/04/2016   Procedure: CYSTOSCOPY WITH RETROGRADE PYELOGRAM, BLADDER BIOPSY, PROSTATE INCISION;  Surgeon: Ihor Gully, MD;  Location: [facility];  Service: Urology;  Laterality: Bilateral;  . TRANSURETHRAL RESECTION OF BLADDER TUMOR N/A 07/04/2016   Procedure: TRANSURETHRAL RESECTION OF BLADDER TUMOR (TURBT);  Surgeon: Ihor Gully, MD;  Location: [facility];  Service: Urology;  Laterality: N/A;    Allergies: Patient has no known allergies.  Medications: Prior to Admission medications   Medication Sig Start Date End Date Taking? Authorizing Provider  HYDROcodone-acetaminophen (NORCO) 10-325 MG tablet Take 1-2 tablets by mouth every 4 (four) hours as needed for moderate pain. Maximum  dose per 24 hours - 8 pills Patient not taking: Reported on 10/14/2016 07/04/16   Ihor Gully, MD  phenazopyridine (PYRIDIUM) 200 MG tablet Take 1 tablet (200 mg total) by mouth 3 (three) times daily as needed for pain. Patient not taking: Reported on 10/14/2016 07/04/16   Ihor Gully, MD     History reviewed. No pertinent family history.  Social History   Social History  . Marital status: Single    Spouse name: N/A  . Number of children: N/A  . Years of education: N/A   Social History Main Topics  . Smoking status: Never Smoker  . Smokeless tobacco: Never Used  . Alcohol use None  . Drug use: No  . Sexual activity: Not Asked   Other Topics Concern  . None   Social History Narrative  . None    Review of Systems: A 12 point ROS discussed and pertinent positives are indicated in the HPI above.  All other systems are negative.  Review of Systems  Constitutional: Positive for activity change, appetite change, fatigue and unexpected weight change. Negative for fever.  Respiratory: Negative for cough and shortness of breath.   Cardiovascular: Negative for chest pain.  Gastrointestinal: Negative for abdominal pain.  Genitourinary: Positive for difficulty urinating.  Neurological: Positive for weakness.  Psychiatric/Behavioral: Negative for behavioral problems and confusion.    Vital Signs: BP (!) 149/61 (BP Location: Right Arm)   Pulse 99   Temp 99.4 F (37.4 C) (Oral)   Resp 16   SpO2 100%   Physical Exam  Constitutional: He is oriented to person, place, and time.  Cardiovascular: Normal rate, regular rhythm and normal heart sounds.  Pulmonary/Chest: Effort normal and breath sounds normal. He has no wheezes.  Abdominal: Soft. Bowel sounds are normal. There is no tenderness.  Musculoskeletal: Normal range of motion.  Neurological: He is alert and oriented to person, place, and time.  Skin: Skin is warm and dry.  Psychiatric: He has a normal mood and affect. His  behavior is normal. Judgment and thought content normal.  Nursing note and vitals reviewed.   Mallampati Score:  MD Evaluation Airway: WNL Heart: WNL Abdomen: WNL Chest/ Lungs: WNL ASA  Classification: 2 Mallampati/Airway Score: Two  Imaging: [place] Intraoperative  Result Date: 10/14/2016 CLINICAL DATA:  Ultrasound was provided for use by the ordering physician, and a technical charge was applied by the performing facility.  No radiologist interpretation/professional services rendered.   Ct Renal Stone Study  Result Date: 10/14/2016 CLINICAL DATA:  Abdominal pain. History of bladder lesion and hydronephrosis. EXAM: CT ABDOMEN AND PELVIS WITHOUT CONTRAST TECHNIQUE: Multidetector CT imaging of the abdomen and pelvis was performed following the standard protocol without oral or intravenous contrast material administration. COMPARISON:  June 02, 2016 FINDINGS: Lower chest: Lung bases are clear. Hepatobiliary: No focal liver lesions are appreciable on this noncontrast enhanced study. Gallbladder wall is not appreciably thickened. There is no biliary duct dilatation. Pancreas: No pancreatic mass or inflammatory focus. Spleen: No splenic lesions are evident. Adrenals/Urinary Tract: Adrenals appear unremarkable. There is marked hydronephrosis bilaterally with diffuse ureterectasis to the level of the urinary bladder. There is no renal mass. No calculi noted in either kidney or ureter. The urinary bladder once again shows diffuse wall thickening in a circumferential manner. No well-defined bladder mass is evident. Stomach/Bowel: There is no appreciable bowel wall thickening. There is somewhat generalized mesenteric thickening due to anasarca. There is no bowel obstruction. No free air or portal venous air. Vascular/Lymphatic: No abdominal aortic aneurysm. No vascular lesions are evident. There is no adenopathy in the abdomen or pelvis. Reproductive: Prostate is enlarged for age. Seminal vesicles appear  unremarkable. Other: Appendix appears unremarkable. No abscess or ascites evident. Musculoskeletal: There are no blastic or lytic bone lesions. There is generalized anasarca. There are no intramuscular lesions. IMPRESSION: Generalized anasarca, likely due to chronic renal obstruction bilaterally. There is again noted severe hydronephrosis and ureterectasis bilaterally with diffuse urinary bladder wall thickening, likely impressing on the respective ureterovesical junction stone causing hydronephrosis in ureterectasis due to the abnormality in the urinary bladder. There is no well-defined urinary bladder mass. The appearance raises question of a chronic inflammatory type lesion causing the generalized bladder wall thickening. It should be noted that neoplastic etiology cannot be excluded. Prostate is enlarged for age. The significance of this finding in light of the other findings is uncertain. There may be a degree of chronic bladder outlet obstruction due to the prostate enlargement. Urologic assessment is advised given these findings. Note that there are no renal or ureteral calculi. Note that the appearance of the prostate, bladder, and kidneys is similar to prior study. No bowel obstruction.  Appendix appears normal.  No adenopathy. Electronically Signed   By: Bretta Bang III M.D.   On: 10/14/2016 15:10    Labs:  CBC:  Recent Labs  07/04/16 0945 07/04/16 1047 10/14/16 1339  WBC  --   --  5.6  HGB 15.0 15.6 9.8*  HCT  --  46.0 27.6*  PLT  --   --  232    COAGS: No results for input(s): INR, APTT in the last 8760 hours.  BMP:  Recent  Labs  07/04/16 1047 10/14/16 1339 10/14/16 1951 10/15/16 0649  NA 142 138 140 138  K 3.7 6.2* 4.6 7.5*  CL 98* 108 109 110  CO2  --  18* 16* 15*  GLUCOSE 96 98 46* 128*  BUN 7 110* 97* 114*  CALCIUM  --  9.4 9.6 9.0  CREATININE 0.80 23.96* 24.74* 24.00*  GFRNONAA  --  2* 2* 2*  GFRAA  --  3* 3* 3*    LIVER FUNCTION TESTS:  Recent Labs   10/15/16 0649  ALBUMIN 3.3*    TUMOR MARKERS: No results for input(s): AFPTM, CEA, CA199, CHROMGRNA in the last 8760 hours.  Assessment and Plan:  B Hydronephrosis Bladder mass; enlarged prostate Attempted ureteral stent cystoscopy yesterday; unsuccessful Now for percutaneous nephrostomy placements Risks and Benefits discussed with the patient including, but not limited to infection, bleeding, significant bleeding causing loss or decrease in renal function or damage to adjacent structures.  All of the patient's questions were answered, patient is agreeable to proceed. Consent signed and in chart.   Thank you for this interesting consult.  I greatly enjoyed meeting Dontreal Miera and look forward to participating in their care.  A copy of this report was sent to the requesting provider on this date.  Electronically Signed: Vail Basista A 10/15/2016, 8:49 AM   I spent a total of 40 Minutes    in face to face in clinical consultation, greater than 50% of which was counseling/coordinating care for B PCN

## 2016-10-15 NOTE — Progress Notes (Signed)
Assessment:  1 AKI due to obstructive uropathy due to bladder/prostate mass 2 Hyperkalemia due to #1, recurrent 3 Anemia of kidney dz 4 Chronic bilateral hydronephrosis-s/p PCNs 5 Bladder/prostate mass of unknown cause 6. Metabolic acidosis due to #1  Plan: 1 Kaexylate & serial labs 2 IVF with bicarbonate  Subjective: Interval History: s/p B PCNs  Objective: Vital signs in last 24 hours: Temp:  [97.1 F (36.2 C)-99.4 F (37.4 C)] 99.4 F (37.4 C) (03/31 0604) Pulse Rate:  [72-103] 89 (03/31 1235) Resp:  [7-23] 10 (03/31 1235) BP: (117-166)/(61-98) 117/71 (03/31 1235) SpO2:  [99 %-100 %] 100 % (03/31 1235) Weight change:   Intake/Output from previous day: 03/30 0701 - 03/31 0700 In: 1100 [I.V.:1100] Out: 4350 [Urine:4350] Intake/Output this shift: Total I/O In: -  Out: 1650 [Urine:1650]  General appearance: very sleepy post procedure Resp: clear to auscultation bilaterally Cardio: regular rate and rhythm, S1, S2 normal, no murmur, click, rub or gallop Skin: Skin color, texture, turgor sl diminished. No rashes or lesions  Lab Results:  Recent Labs  10/14/16 1339  WBC 5.6  HGB 9.8*  HCT 27.6*  PLT 232   BMET:  Recent Labs  10/14/16 1951 10/15/16 0649  NA 140 138  K 4.6 7.5*  CL 109 110  CO2 16* 15*  GLUCOSE 46* 128*  BUN 97* 114*  CREATININE 24.74* 24.00*  CALCIUM 9.6 9.0   No results for input(s): PTH in the last 72 hours. Iron Studies: No results for input(s): IRON, TIBC, TRANSFERRIN, FERRITIN in the last 72 hours. Studies/Results: [place] Intraoperative  Result Date: 10/14/2016 CLINICAL DATA:  Ultrasound was provided for use by the ordering physician, and a technical charge was applied by the performing facility.  No radiologist interpretation/professional services rendered.   Ct Renal Stone Study  Result Date: 10/14/2016 CLINICAL DATA:  Abdominal pain. History of bladder lesion and hydronephrosis. EXAM: CT ABDOMEN AND PELVIS WITHOUT CONTRAST  TECHNIQUE: Multidetector CT imaging of the abdomen and pelvis was performed following the standard protocol without oral or intravenous contrast material administration. COMPARISON:  June 02, 2016 FINDINGS: Lower chest: Lung bases are clear. Hepatobiliary: No focal liver lesions are appreciable on this noncontrast enhanced study. Gallbladder wall is not appreciably thickened. There is no biliary duct dilatation. Pancreas: No pancreatic mass or inflammatory focus. Spleen: No splenic lesions are evident. Adrenals/Urinary Tract: Adrenals appear unremarkable. There is marked hydronephrosis bilaterally with diffuse ureterectasis to the level of the urinary bladder. There is no renal mass. No calculi noted in either kidney or ureter. The urinary bladder once again shows diffuse wall thickening in a circumferential manner. No well-defined bladder mass is evident. Stomach/Bowel: There is no appreciable bowel wall thickening. There is somewhat generalized mesenteric thickening due to anasarca. There is no bowel obstruction. No free air or portal venous air. Vascular/Lymphatic: No abdominal aortic aneurysm. No vascular lesions are evident. There is no adenopathy in the abdomen or pelvis. Reproductive: Prostate is enlarged for age. Seminal vesicles appear unremarkable. Other: Appendix appears unremarkable. No abscess or ascites evident. Musculoskeletal: There are no blastic or lytic bone lesions. There is generalized anasarca. There are no intramuscular lesions. IMPRESSION: Generalized anasarca, likely due to chronic renal obstruction bilaterally. There is again noted severe hydronephrosis and ureterectasis bilaterally with diffuse urinary bladder wall thickening, likely impressing on the respective ureterovesical junction stone causing hydronephrosis in ureterectasis due to the abnormality in the urinary bladder. There is no well-defined urinary bladder mass. The appearance raises question of a chronic inflammatory type  lesion causing the generalized bladder wall thickening. It should be noted that neoplastic etiology cannot be excluded. Prostate is enlarged for age. The significance of this finding in light of the other findings is uncertain. There may be a degree of chronic bladder outlet obstruction due to the prostate enlargement. Urologic assessment is advised given these findings. Note that there are no renal or ureteral calculi. Note that the appearance of the prostate, bladder, and kidneys is similar to prior study. No bowel obstruction.  Appendix appears normal.  No adenopathy. Electronically Signed   By: Bretta Bang III M.D.   On: 10/14/2016 15:10    Scheduled: . senna-docusate  1 tablet Oral BID     LOS: 1 day   Kaylah Chiasson C 10/15/2016,1:30 PM

## 2016-10-15 NOTE — Anesthesia Postprocedure Evaluation (Addendum)
Anesthesia Post Note  Patient: Alexander Sims  Procedure(s) Performed: Procedure(s) (LRB): CYSTOSCOPY AND BLADDER BIOPSY WITH FULGERATION (Bilateral) TRANSRECTAL ULTRASOUND FOR PROSTATE BIOPSY (N/A)  Patient location during evaluation: PACU Anesthesia Type: General Level of consciousness: awake and patient cooperative Pain management: pain level controlled Vital Signs Assessment: post-procedure vital signs reviewed and stable Respiratory status: spontaneous breathing, nonlabored ventilation, respiratory function stable and patient connected to nasal cannula oxygen Cardiovascular status: blood pressure returned to baseline and stable Postop Assessment: no signs of nausea or vomiting Anesthetic complications: no       Last Vitals:  Vitals:   10/14/16 2115 10/15/16 0217  BP: (!) 137/92 132/73  Pulse: 93 88  Resp: 16 16  Temp: 36.2 C 36.9 C    Last Pain:  Vitals:   10/15/16 0217  TempSrc: Oral  PainSc:                  Lamount Bankson

## 2016-10-15 NOTE — Procedures (Signed)
  Pre-operative Diagnosis: Acute on chronic renal failure with obstructive uropathy       Post-operative Diagnosis: Acute on chronic renal failure with obstructive uropathy   Indications: Bilateral hydronephrosis  Procedure: Bilateral nephrostomy tube placement  Findings: Severe bilateral hydronephrosis.  Placement of bilateral 10 Fr drains.  Complications: none     EBL: minimal  Plan: Follow output.

## 2016-10-16 LAB — COMPREHENSIVE METABOLIC PANEL
ALT: 18 U/L (ref 17–63)
AST: 17 U/L (ref 15–41)
Albumin: 3.8 g/dL (ref 3.5–5.0)
Alkaline Phosphatase: 54 U/L (ref 38–126)
Anion gap: 10 (ref 5–15)
BILIRUBIN TOTAL: 0.7 mg/dL (ref 0.3–1.2)
BUN: 56 mg/dL — AB (ref 6–20)
CO2: 23 mmol/L (ref 22–32)
CREATININE: 7.09 mg/dL — AB (ref 0.61–1.24)
Calcium: 8.7 mg/dL — ABNORMAL LOW (ref 8.9–10.3)
Chloride: 108 mmol/L (ref 101–111)
GFR, EST AFRICAN AMERICAN: 12 mL/min — AB (ref >60)
GFR, EST NON AFRICAN AMERICAN: 10 mL/min — AB (ref >60)
Glucose, Bld: 103 mg/dL — ABNORMAL HIGH (ref 65–99)
POTASSIUM: 4.5 mmol/L (ref 3.5–5.1)
Sodium: 141 mmol/L (ref 135–145)
Total Protein: 7.2 g/dL (ref 6.5–8.1)

## 2016-10-16 LAB — CBC
HCT: 24.8 % — ABNORMAL LOW (ref 39.0–52.0)
Hemoglobin: 8.6 g/dL — ABNORMAL LOW (ref 13.0–17.0)
MCH: 26.5 pg (ref 26.0–34.0)
MCHC: 34.7 g/dL (ref 30.0–36.0)
MCV: 76.5 fL — AB (ref 78.0–100.0)
PLATELETS: 234 10*3/uL (ref 150–400)
RBC: 3.24 MIL/uL — AB (ref 4.22–5.81)
RDW: 14 % (ref 11.5–15.5)
WBC: 7.9 10*3/uL (ref 4.0–10.5)

## 2016-10-16 MED ORDER — SODIUM CHLORIDE 0.45 % IV SOLN
INTRAVENOUS | Status: DC
  Administered 2016-10-16 – 2016-10-17 (×7): via INTRAVENOUS
  Administered 2016-10-17: 250 mL/h via INTRAVENOUS
  Administered 2016-10-18 (×2): via INTRAVENOUS

## 2016-10-16 MED ORDER — VITAMINS A & D EX OINT
TOPICAL_OINTMENT | CUTANEOUS | Status: AC
  Filled 2016-10-16: qty 5

## 2016-10-16 MED ORDER — SODIUM BICARBONATE 8.4 % IV SOLN
INTRAVENOUS | Status: DC
  Administered 2016-10-16: 10:00:00 via INTRAVENOUS

## 2016-10-16 NOTE — Progress Notes (Signed)
Assessment: 1 - Acute on Chronic Renal Failure- Cr. He has now decreased significantly to 7.09 today. His potassium is normal at 4.5. Bilateral percutaneous nephrostomy tubes were placed yesterday. It revealed bilateral intrarenal and ureteral dilatation with tortuosity of both of his ureters. He has had significant output from his nephrostomy tubes. He is having much greater output from the left side than the right indicating some possible intrinsic renal disease on the right hand side.  2 - Severe Bilateral Hydronephrosis- He now has bilateral nephrostomy tubes indwelling. Pathology from his recent bladder and prostate biopsies remains pending.   3- Bladder-Prostate Thickening / Rule Out Neoplasm- he currently is maintaining a Foley catheter which needs to remain in place until after his nephrostomy tubes are internalized with stents and nephrostomy tubes have been removed. Pathology remains pending at this time.  Plan: 1. Continue monitoring creatinine. 2. Maintain Foley catheter. 3. I will ask IR to place him on the schedule for elective internalization of his nephrostomy tubes with double-J stents.    Subjective: Patient reports feeling much better today.  Objective: Vital signs in last 24 hours: Temp:  [98.1 F (36.7 C)-98.7 F (37.1 C)] 98.5 F (36.9 C) (04/01 0555) Pulse Rate:  [85-99] 96 (04/01 0555) Resp:  [7-21] 16 (04/01 0555) BP: (117-131)/(59-76) 121/59 (04/01 0555) SpO2:  [99 %-100 %] 99 % (04/01 0555)A  Intake/Output from previous day: 03/31 0701 - 04/01 0700 In: 2357.9 [P.O.:360; I.V.:1447.9] Out: 7805 [Urine:7805] Intake/Output this shift: Total I/O In: 450 [Other:450] Out: -   Past Medical History:  Diagnosis Date  . Bladder mass 05/2016  . Hydronephrosis, bilateral 05/2016    Physical Exam:  Lungs - Normal respiratory effort, chest expands symmetrically.  Abdomen - Soft, non-tender & non-distended. Foley catheter indwelling with slightly pink  urine and nephrostomy tubes draining bilaterally.  Lab Results:  Recent Labs  10/14/16 1339 10/16/16 0533  WBC 5.6 7.9  HGB 9.8* 8.6*  HCT 27.6* 24.8*   BMET  Recent Labs  10/15/16 1327 10/16/16 0533  NA 140 141  K 6.3* 4.5  CL 111 108  CO2 18* 23  GLUCOSE 112* 103*  BUN 116* 56*  CREATININE 22.39* 7.09*  CALCIUM 9.5 8.7*   No results for input(s): LABURIN in the last 72 hours. Results for orders placed or performed during the hospital encounter of 10/14/16  Surgical pcr screen     Status: None   Collection Time: 10/14/16  6:00 PM  Result Value Ref Range Status   MRSA, PCR NEGATIVE NEGATIVE Final   Staphylococcus aureus NEGATIVE NEGATIVE Final    Comment:        The Xpert SA Assay (FDA approved for NASAL specimens in patients over 25 years of age), is one component of a comprehensive surveillance program.  Test performance has been validated by [facility] for patients greater than or equal to 67 year old. It is not intended to diagnose infection nor to guide or monitor treatment.     Studies/Results: Ir Nephrostomy Placement Left  Result Date: 10/15/2016 INDICATION: 21 year old with acute on chronic renal failure. Bilateral hydronephrosis. EXAM: PLACEMENT OF BILATERAL PERCUTANEOUS NEPHROSTOMY TUBES WITH ULTRASOUND AND FLUOROSCOPIC GUIDANCE COMPARISON:  Abdominal CT 10/14/2016 MEDICATIONS: Ciprofloxacin 400 mg; The antibiotic was administered in an appropriate time frame prior to skin puncture. ANESTHESIA/SEDATION: Fentanyl 75 mcg IV; Versed 5.0 mg IV Moderate Sedation Time:  33 minutes The patient was continuously monitored during the procedure by the interventional radiology nurse under my direct supervision. CONTRAST:  25  mL Isovue-300 - administered into the collecting system(s) FLUOROSCOPY TIME:  Fluoroscopy Time: 2 minutes 48 seconds (15 mGy). COMPLICATIONS: None immediate. PROCEDURE: Informed written consent was obtained from the patient after a thorough  discussion of the procedural risks, benefits and alternatives. All questions were addressed. Maximal Sterile Barrier Technique was utilized including caps, mask, sterile gowns, sterile gloves, sterile drape, hand hygiene and skin antiseptic. A timeout was performed prior to the initiation of the procedure. Patient was placed prone. Patient's back was prepped and draped in sterile fashion. Ultrasound demonstrated severe bilateral hydronephrosis. The left flank was anesthetized with 1% lidocaine. 21 gauge needle directed into a dilated lower pole calyx with ultrasound guidance. Clear urine was draining from the needle hub. A 0.018 wire was advanced into the renal collecting system and a dilator set was placed. Bentson wire was placed. The tract was dilated to accommodate a 10.2 [place] multipurpose drain. Contrast injection confirmed placement in the renal pelvis. Catheter was sutured to skin and attached to gravity bag. The right flank was anesthetized with 1% lidocaine. 21 gauge needle directed into a mid/lower pole calyx with ultrasound guidance. 0.018 wire was advanced into the renal pelvis. Dilator set was placed. Bentson wire was advanced into the collecting system and tract was dilated to accommodate a 10.2 [place] multipurpose drain. Drain was placed in the renal pelvis. Catheter was sutured to skin and attached to gravity bag. Fluoroscopic and ultrasound images were taken and saved for documentation. FINDINGS: Severe bilateral hydronephrosis. Placement of 10.2 French drains in both kidneys. Drains positioned in the renal pelvis bilaterally. IMPRESSION: Successful placement of bilateral percutaneous nephrostomy tubes with ultrasound and fluoroscopic guidance. Electronically Signed   By: Richarda Overlie M.D.   On: 10/15/2016 13:45   Ir Nephrostomy Placement Right  Result Date: 10/15/2016 INDICATION: 21 year old with acute on chronic renal failure. Bilateral hydronephrosis. EXAM: PLACEMENT OF BILATERAL PERCUTANEOUS  NEPHROSTOMY TUBES WITH ULTRASOUND AND FLUOROSCOPIC GUIDANCE COMPARISON:  Abdominal CT 10/14/2016 MEDICATIONS: Ciprofloxacin 400 mg; The antibiotic was administered in an appropriate time frame prior to skin puncture. ANESTHESIA/SEDATION: Fentanyl 75 mcg IV; Versed 5.0 mg IV Moderate Sedation Time:  33 minutes The patient was continuously monitored during the procedure by the interventional radiology nurse under my direct supervision. CONTRAST:  25 mL Isovue-300 - administered into the collecting system(s) FLUOROSCOPY TIME:  Fluoroscopy Time: 2 minutes 48 seconds (15 mGy). COMPLICATIONS: None immediate. PROCEDURE: Informed written consent was obtained from the patient after a thorough discussion of the procedural risks, benefits and alternatives. All questions were addressed. Maximal Sterile Barrier Technique was utilized including caps, mask, sterile gowns, sterile gloves, sterile drape, hand hygiene and skin antiseptic. A timeout was performed prior to the initiation of the procedure. Patient was placed prone. Patient's back was prepped and draped in sterile fashion. Ultrasound demonstrated severe bilateral hydronephrosis. The left flank was anesthetized with 1% lidocaine. 21 gauge needle directed into a dilated lower pole calyx with ultrasound guidance. Clear urine was draining from the needle hub. A 0.018 wire was advanced into the renal collecting system and a dilator set was placed. Bentson wire was placed. The tract was dilated to accommodate a 10.2 [place] multipurpose drain. Contrast injection confirmed placement in the renal pelvis. Catheter was sutured to skin and attached to gravity bag. The right flank was anesthetized with 1% lidocaine. 21 gauge needle directed into a mid/lower pole calyx with ultrasound guidance. 0.018 wire was advanced into the renal pelvis. Dilator set was placed. Bentson wire was advanced into the collecting  system and tract was dilated to accommodate a 10.2 [place] multipurpose  drain. Drain was placed in the renal pelvis. Catheter was sutured to skin and attached to gravity bag. Fluoroscopic and ultrasound images were taken and saved for documentation. FINDINGS: Severe bilateral hydronephrosis. Placement of 10.2 French drains in both kidneys. Drains positioned in the renal pelvis bilaterally. IMPRESSION: Successful placement of bilateral percutaneous nephrostomy tubes with ultrasound and fluoroscopic guidance. Electronically Signed   By: Richarda Overlie M.D.   On: 10/15/2016 13:45      Kenetha Cozza C 10/16/2016, 8:44 AM

## 2016-10-16 NOTE — Progress Notes (Signed)
Assessment:  1 AKI due to obstructive uropathy due to bladder/prostate mass, improving function 2 Hyperkalemia due to #1, recurrent 3 Anemia of kidney dz 4 Chronic bilateral hydronephrosis-s/p PCNsw with high output 5 Bladder/prostate mass of unknown cause 6. Metabolic acidosis due to #1, improved  Plan: Increase fluids  Subjective: Interval History: high UOP  Objective: Vital signs in last 24 hours: Temp:  [98.1 F (36.7 C)-98.7 F (37.1 C)] 98.5 F (36.9 C) (04/01 0555) Pulse Rate:  [85-99] 96 (04/01 0555) Resp:  [7-21] 16 (04/01 0555) BP: (117-131)/(59-76) 121/59 (04/01 0555) SpO2:  [99 %-100 %] 99 % (04/01 0555) Weight change:   Intake/Output from previous day: 03/31 0701 - 04/01 0700 In: 2357.9 [P.O.:360; I.V.:1447.9] Out: 7805 [Urine:7805] Intake/Output this shift: Total I/O In: 450 [Other:450] Out: -   General appearance: alert and cooperative Resp: clear to auscultation bilaterally Cardio: regular rate and rhythm, S1, S2 normal, no murmur, click, rub or gallop Extremities: ? reduced skin turgor Skin: Skin color, texture, turgor normal. No rashes or lesions  Lab Results:  Recent Labs  10/14/16 1339 10/16/16 0533  WBC 5.6 7.9  HGB 9.8* 8.6*  HCT 27.6* 24.8*  PLT 232 234   BMET:  Recent Labs  10/15/16 1327 10/16/16 0533  NA 140 141  K 6.3* 4.5  CL 111 108  CO2 18* 23  GLUCOSE 112* 103*  BUN 116* 56*  CREATININE 22.39* 7.09*  CALCIUM 9.5 8.7*   No results for input(s): PTH in the last 72 hours. Iron Studies: No results for input(s): IRON, TIBC, TRANSFERRIN, FERRITIN in the last 72 hours. Studies/Results: [place] Intraoperative  Result Date: 10/14/2016 CLINICAL DATA:  Ultrasound was provided for use by the ordering physician, and a technical charge was applied by the performing facility.  No radiologist interpretation/professional services rendered.   Ct Renal Stone Study  Result Date: 10/14/2016 CLINICAL DATA:  Abdominal pain. History of  bladder lesion and hydronephrosis. EXAM: CT ABDOMEN AND PELVIS WITHOUT CONTRAST TECHNIQUE: Multidetector CT imaging of the abdomen and pelvis was performed following the standard protocol without oral or intravenous contrast material administration. COMPARISON:  June 02, 2016 FINDINGS: Lower chest: Lung bases are clear. Hepatobiliary: No focal liver lesions are appreciable on this noncontrast enhanced study. Gallbladder wall is not appreciably thickened. There is no biliary duct dilatation. Pancreas: No pancreatic mass or inflammatory focus. Spleen: No splenic lesions are evident. Adrenals/Urinary Tract: Adrenals appear unremarkable. There is marked hydronephrosis bilaterally with diffuse ureterectasis to the level of the urinary bladder. There is no renal mass. No calculi noted in either kidney or ureter. The urinary bladder once again shows diffuse wall thickening in a circumferential manner. No well-defined bladder mass is evident. Stomach/Bowel: There is no appreciable bowel wall thickening. There is somewhat generalized mesenteric thickening due to anasarca. There is no bowel obstruction. No free air or portal venous air. Vascular/Lymphatic: No abdominal aortic aneurysm. No vascular lesions are evident. There is no adenopathy in the abdomen or pelvis. Reproductive: Prostate is enlarged for age. Seminal vesicles appear unremarkable. Other: Appendix appears unremarkable. No abscess or ascites evident. Musculoskeletal: There are no blastic or lytic bone lesions. There is generalized anasarca. There are no intramuscular lesions. IMPRESSION: Generalized anasarca, likely due to chronic renal obstruction bilaterally. There is again noted severe hydronephrosis and ureterectasis bilaterally with diffuse urinary bladder wall thickening, likely impressing on the respective ureterovesical junction stone causing hydronephrosis in ureterectasis due to the abnormality in the urinary bladder. There is no well-defined  urinary bladder mass. The  appearance raises question of a chronic inflammatory type lesion causing the generalized bladder wall thickening. It should be noted that neoplastic etiology cannot be excluded. Prostate is enlarged for age. The significance of this finding in light of the other findings is uncertain. There may be a degree of chronic bladder outlet obstruction due to the prostate enlargement. Urologic assessment is advised given these findings. Note that there are no renal or ureteral calculi. Note that the appearance of the prostate, bladder, and kidneys is similar to prior study. No bowel obstruction.  Appendix appears normal.  No adenopathy. Electronically Signed   By: Bretta Bang III M.D.   On: 10/14/2016 15:10   Ir Nephrostomy Placement Left  Result Date: 10/15/2016 INDICATION: 21 year old with acute on chronic renal failure. Bilateral hydronephrosis. EXAM: PLACEMENT OF BILATERAL PERCUTANEOUS NEPHROSTOMY TUBES WITH ULTRASOUND AND FLUOROSCOPIC GUIDANCE COMPARISON:  Abdominal CT 10/14/2016 MEDICATIONS: Ciprofloxacin 400 mg; The antibiotic was administered in an appropriate time frame prior to skin puncture. ANESTHESIA/SEDATION: Fentanyl 75 mcg IV; Versed 5.0 mg IV Moderate Sedation Time:  33 minutes The patient was continuously monitored during the procedure by the interventional radiology nurse under my direct supervision. CONTRAST:  25 mL Isovue-300 - administered into the collecting system(s) FLUOROSCOPY TIME:  Fluoroscopy Time: 2 minutes 48 seconds (15 mGy). COMPLICATIONS: None immediate. PROCEDURE: Informed written consent was obtained from the patient after a thorough discussion of the procedural risks, benefits and alternatives. All questions were addressed. Maximal Sterile Barrier Technique was utilized including caps, mask, sterile gowns, sterile gloves, sterile drape, hand hygiene and skin antiseptic. A timeout was performed prior to the initiation of the procedure. Patient was placed  prone. Patient's back was prepped and draped in sterile fashion. Ultrasound demonstrated severe bilateral hydronephrosis. The left flank was anesthetized with 1% lidocaine. 21 gauge needle directed into a dilated lower pole calyx with ultrasound guidance. Clear urine was draining from the needle hub. A 0.018 wire was advanced into the renal collecting system and a dilator set was placed. Bentson wire was placed. The tract was dilated to accommodate a 10.2 [place] multipurpose drain. Contrast injection confirmed placement in the renal pelvis. Catheter was sutured to skin and attached to gravity bag. The right flank was anesthetized with 1% lidocaine. 21 gauge needle directed into a mid/lower pole calyx with ultrasound guidance. 0.018 wire was advanced into the renal pelvis. Dilator set was placed. Bentson wire was advanced into the collecting system and tract was dilated to accommodate a 10.2 [place] multipurpose drain. Drain was placed in the renal pelvis. Catheter was sutured to skin and attached to gravity bag. Fluoroscopic and ultrasound images were taken and saved for documentation. FINDINGS: Severe bilateral hydronephrosis. Placement of 10.2 French drains in both kidneys. Drains positioned in the renal pelvis bilaterally. IMPRESSION: Successful placement of bilateral percutaneous nephrostomy tubes with ultrasound and fluoroscopic guidance. Electronically Signed   By: Richarda Overlie M.D.   On: 10/15/2016 13:45   Ir Nephrostomy Placement Right  Result Date: 10/15/2016 INDICATION: 21 year old with acute on chronic renal failure. Bilateral hydronephrosis. EXAM: PLACEMENT OF BILATERAL PERCUTANEOUS NEPHROSTOMY TUBES WITH ULTRASOUND AND FLUOROSCOPIC GUIDANCE COMPARISON:  Abdominal CT 10/14/2016 MEDICATIONS: Ciprofloxacin 400 mg; The antibiotic was administered in an appropriate time frame prior to skin puncture. ANESTHESIA/SEDATION: Fentanyl 75 mcg IV; Versed 5.0 mg IV Moderate Sedation Time:  33 minutes The patient was  continuously monitored during the procedure by the interventional radiology nurse under my direct supervision. CONTRAST:  25 mL Isovue-300 - administered into the collecting system(s) FLUOROSCOPY  TIME:  Fluoroscopy Time: 2 minutes 48 seconds (15 mGy). COMPLICATIONS: None immediate. PROCEDURE: Informed written consent was obtained from the patient after a thorough discussion of the procedural risks, benefits and alternatives. All questions were addressed. Maximal Sterile Barrier Technique was utilized including caps, mask, sterile gowns, sterile gloves, sterile drape, hand hygiene and skin antiseptic. A timeout was performed prior to the initiation of the procedure. Patient was placed prone. Patient's back was prepped and draped in sterile fashion. Ultrasound demonstrated severe bilateral hydronephrosis. The left flank was anesthetized with 1% lidocaine. 21 gauge needle directed into a dilated lower pole calyx with ultrasound guidance. Clear urine was draining from the needle hub. A 0.018 wire was advanced into the renal collecting system and a dilator set was placed. Bentson wire was placed. The tract was dilated to accommodate a 10.2 [place] multipurpose drain. Contrast injection confirmed placement in the renal pelvis. Catheter was sutured to skin and attached to gravity bag. The right flank was anesthetized with 1% lidocaine. 21 gauge needle directed into a mid/lower pole calyx with ultrasound guidance. 0.018 wire was advanced into the renal pelvis. Dilator set was placed. Bentson wire was advanced into the collecting system and tract was dilated to accommodate a 10.2 [place] multipurpose drain. Drain was placed in the renal pelvis. Catheter was sutured to skin and attached to gravity bag. Fluoroscopic and ultrasound images were taken and saved for documentation. FINDINGS: Severe bilateral hydronephrosis. Placement of 10.2 French drains in both kidneys. Drains positioned in the renal pelvis bilaterally. IMPRESSION:  Successful placement of bilateral percutaneous nephrostomy tubes with ultrasound and fluoroscopic guidance. Electronically Signed   By: Richarda Overlie M.D.   On: 10/15/2016 13:45    Scheduled: . senna-docusate  1 tablet Oral BID  . vitamin A & D         LOS: 2 days   Alexander Sims C 10/16/2016,9:07 AM

## 2016-10-17 ENCOUNTER — Encounter (HOSPITAL_COMMUNITY): Payer: Self-pay | Admitting: Urology

## 2016-10-17 ENCOUNTER — Inpatient Hospital Stay (HOSPITAL_COMMUNITY): Payer: BLUE CROSS/BLUE SHIELD

## 2016-10-17 HISTORY — PX: IR NEPHROSTOMY EXCHANGE RIGHT: IMG6070

## 2016-10-17 HISTORY — PX: IR NEPHROSTOMY EXCHANGE LEFT: IMG6069

## 2016-10-17 HISTORY — PX: IR URETERAL STENT PLACEMENT EXISTING ACCESS LEFT: IMG6073

## 2016-10-17 LAB — BASIC METABOLIC PANEL
Anion gap: 8 (ref 5–15)
BUN: 21 mg/dL — AB (ref 6–20)
CHLORIDE: 102 mmol/L (ref 101–111)
CO2: 26 mmol/L (ref 22–32)
Calcium: 8.5 mg/dL — ABNORMAL LOW (ref 8.9–10.3)
Creatinine, Ser: 2.09 mg/dL — ABNORMAL HIGH (ref 0.61–1.24)
GFR calc Af Amer: 51 mL/min — ABNORMAL LOW (ref >60)
GFR calc non Af Amer: 44 mL/min — ABNORMAL LOW (ref >60)
Glucose, Bld: 94 mg/dL (ref 65–99)
POTASSIUM: 4.4 mmol/L (ref 3.5–5.1)
Sodium: 136 mmol/L (ref 135–145)

## 2016-10-17 MED ORDER — MIDAZOLAM HCL 2 MG/2ML IJ SOLN
INTRAMUSCULAR | Status: AC | PRN
  Administered 2016-10-17 (×6): 1 mg via INTRAVENOUS

## 2016-10-17 MED ORDER — FENTANYL CITRATE (PF) 100 MCG/2ML IJ SOLN
INTRAMUSCULAR | Status: AC | PRN
  Administered 2016-10-17: 50 ug via INTRAVENOUS
  Administered 2016-10-17 (×2): 25 ug via INTRAVENOUS

## 2016-10-17 MED ORDER — LIDOCAINE HCL 1 % IJ SOLN
INTRAMUSCULAR | Status: AC
  Filled 2016-10-17: qty 20

## 2016-10-17 MED ORDER — IOPAMIDOL (ISOVUE-300) INJECTION 61%: 50.0000 mL | Freq: Once | INTRAVENOUS | Status: DC | PRN

## 2016-10-17 MED ORDER — FENTANYL CITRATE (PF) 100 MCG/2ML IJ SOLN
INTRAMUSCULAR | Status: AC
  Filled 2016-10-17: qty 2

## 2016-10-17 MED ORDER — MIDAZOLAM HCL 2 MG/2ML IJ SOLN
INTRAMUSCULAR | Status: AC
  Filled 2016-10-17: qty 6

## 2016-10-17 MED ORDER — CIPROFLOXACIN IN D5W 400 MG/200ML IV SOLN
400.0000 mg | INTRAVENOUS | Status: AC
  Administered 2016-10-17: 400 mg via INTRAVENOUS

## 2016-10-17 MED ORDER — IOPAMIDOL (ISOVUE-300) INJECTION 61%
50.0000 mL | Freq: Once | INTRAVENOUS | Status: AC | PRN
  Administered 2016-10-17: 40 mL

## 2016-10-17 MED ORDER — LORAZEPAM 2 MG/ML IJ SOLN: INTRAMUSCULAR | Status: AC | PRN

## 2016-10-17 MED ORDER — CIPROFLOXACIN IN D5W 400 MG/200ML IV SOLN
INTRAVENOUS | Status: AC
Start: 2016-10-17 — End: 2016-10-18
  Filled 2016-10-17: qty 200

## 2016-10-17 MED ORDER — IOPAMIDOL (ISOVUE-300) INJECTION 61%
INTRAVENOUS | Status: AC
  Filled 2016-10-17: qty 50

## 2016-10-17 NOTE — Progress Notes (Signed)
3 Days Post-Op  Assessment: Bilateral hydronephrosis - he has chronic bilateral hydronephrosis with dilation of his ureters down to the ureterovesical junction.  With nephrostomy tubes having been placed he's had significant improvement in his creatinine which has fallen down to 2.09 today.  He is having a lot more urine output from the left side than the right.  He is scheduled for internalization with bilateral double-J stents placed antegrade today by interventional radiology.  Bladder/prostate abnormality - This has been biopsied.  The pathology remains pending.  Plan: 1.  Antegrade bilateral double-J stent placement today. 2.  My hope is that his nephrostomy tubes will be removed as well. 3.  He could potentially be discharged later today or early tomorrow.   Subjective: Patient is without complaint.  Objective: Vital signs in last 24 hours: Temp:  [98.1 F (36.7 C)-98.3 F (36.8 C)] 98.3 F (36.8 C) (04/02 1537) Pulse Rate:  [73-99] 73 (04/02 1537) Resp:  [10-22] 13 (04/02 1537) BP: (124-152)/(69-100) 125/81 (04/02 1537) SpO2:  [98 %-100 %] 100 % (04/02 1537)  Intake/Output from previous day: 04/01 0701 - 04/02 0700 In: 6397.5 [P.O.:1560; I.V.:3887.5] Out: 4076 [Urine:4075; Stool:1] Intake/Output this shift: Total I/O In: 1691.7 [I.V.:1691.7] Out: 1050 [Urine:1050]  Past Medical History:  Diagnosis Date  . Bladder mass 05/2016  . Hydronephrosis, bilateral 05/2016   Current Facility-Administered Medications  Medication Dose Route Frequency Provider Last Rate Last Dose  . 0.45 % sodium chloride infusion   Intravenous Continuous Lauris Poag, MD   Stopped at 10/17/16 1330  . 0.9 %  sodium chloride infusion   Intravenous Continuous Lorre Nick, MD      . HYDROcodone-acetaminophen (NORCO/VICODIN) 5-325 MG per tablet 1-2 tablet  1-2 tablet Oral Q4H PRN Sebastian Ache, MD   2 tablet at 10/16/16 1654  . HYDROmorphone (DILAUDID) injection 0.5-1 mg  0.5-1 mg Intravenous  Q2H PRN Sebastian Ache, MD   1 mg at 10/15/16 1417  . iopamidol (ISOVUE-300) 61 % injection 50 mL  50 mL Other Once PRN Richarda Overlie, MD      . lidocaine (XYLOCAINE) 1 % (with pres) injection           . ondansetron (ZOFRAN) injection 4 mg  4 mg Intravenous Q4H PRN Sebastian Ache, MD      . senna-docusate (Senokot-S) tablet 1 tablet  1 tablet Oral BID Sebastian Ache, MD   1 tablet at 10/15/16 2200    Physical Exam:  General: Patient is in no apparent distress Lungs: Normal respiratory effort, chest expands symmetrically. GI: The abdomen is soft and nontender without mass.    Lab Results:  Recent Labs  10/16/16 0533  WBC 7.9  HGB 8.6*  HCT 24.8*   BMET  Recent Labs  10/16/16 0533 10/17/16 0508  NA 141 136  K 4.5 4.4  CL 108 102  CO2 23 26  GLUCOSE 103* 94  BUN 56* 21*  CREATININE 7.09* 2.09*  CALCIUM 8.7* 8.5*    Recent Labs  10/15/16 0923  INR 1.24   No results for input(s): LABURIN in the last 72 hours. Results for orders placed or performed during the hospital encounter of 10/14/16  Surgical pcr screen     Status: None   Collection Time: 10/14/16  6:00 PM  Result Value Ref Range Status   MRSA, PCR NEGATIVE NEGATIVE Final   Staphylococcus aureus NEGATIVE NEGATIVE Final    Comment:        The Xpert SA Assay (FDA approved for NASAL specimens  in patients over 46 years of age), is one component of a comprehensive surveillance program.  Test performance has been validated by [facility] for patients greater than or equal to 21 year old. It is not intended to diagnose infection nor to guide or monitor treatment.     Studies/Results: No results found.     Garnett Farm 10/17/2016, 3:49 PM

## 2016-10-17 NOTE — Op Note (Signed)
NAME:  Alexander Sims, Alexander Sims                       ACCOUNT NO.:  MEDICAL RECORD NO.:  [id number]  LOCATION:                                 FACILITY:  PHYSICIAN:  Sebastian Ache, MD          DATE OF BIRTH:  DATE OF PROCEDURE:                              OPERATIVE REPORT   PREOPERATIVE DIAGNOSES: 1. Acute renal failure. 2. Bilateral hydronephrosis. 3. History of abnormal bladder neck and prostate tissue.  POSTOPERATIVE DIAGNOSES: 1. Acute renal failure. 2. Bilateral hydronephrosis. 3. Abnormal bladder neck and prostate tissue.  PROCEDURE: 1. Bladder biopsy with fulguration. 2. Transrectal ultrasound with interpretation. 3. Prostate biopsy.  ESTIMATED BLOOD LOSS:  Nil.  COMPLICATION:  None.  SPECIMEN: 1. Abnormal bladder neck tissue for permanent pathology. 2. Right prostate cores x2. 3. Left prostate cores x2.  FINDINGS: 1. A very abnormal papillary and nodular tissue over growing the area     of trigone, base of prostate, bladder neck area. 2. Nonvisualization or cannulation of the ureteral orifice despite     multiple maneuvers, inability to obtain ureteral access or stent     placement. 3. Prostate 51 g, homogeneous by transrectal ultrasound.  DRAINS: 1. A 24-French 3-way Foley catheter to normal saline irrigation,     efflux clear.  INDICATION:  Alexander Sims is a 21 year old young man with unusual history of significant hydronephrosis, irritative voiding symptoms.  He underwent evaluation several months ago, was found to have hydronephrosis down to what appeared to be the ureterovesical junction, prostate area, and underwent bladder biopsy at that time, which revealed favored benign changes.  His creatinine fortunately at that time was not abnormal.  We gave him a trial of medical therapy.  He presented with worsening voiding symptoms and malaise.  He underwent office lab exam yesterday, which results came back early this morning, revealing severe acute depression of his  renal function with a creatinine of over 20. BUN of over 90.  He was counseled to present to the hospital emergently which he did.  Repeat labs were performed which corroborated this acute change and dangerous level of renal function.  Repeat noncontrast CT was performed, which revealed hydronephrosis down to the area of bladder neck as per before.  Options were discussed including nephrostomy drainage, presented to the most definitive means of acute management versus attempt at retrograde evaluation with stenting with bladder and prostate biopsy with goal of also maximally ruling out neoplasm, and the patient was to proceed with the latter, understanding that nephrostomy tubes may be warranted if failure to progress. Informed consent was obtained and placed in the medical record.  PROCEDURE IN DETAIL:  The patient being, Alexander Sims, verified. Procedure being cysto with attempt at bilateral retrograde stent placement, bladder biopsy and prostate biopsy were confirmed.  Procedure was carried out.  Time out was performed.  Intravenous antibiotics were administered.  General anesthesia was induced.  The patient was placed into a low lithotomy position.  Sterile field was created by prepping and draping the patient's penis, perineum, and proximal thighs using iodine.  After digital rectal exam, which was performed which revealed an empty  rectal vault and no obvious nodular tissue.  Cystourethroscopy was performed using a rigid cystoscope with offset lens.  Inspection of the anterior and posterior urethra revealed significant for age bilobar prostatic hypertrophy with frondular papillary changes of the prostatic urethra from the verumontanum proximal, covering the bladder neck and trigone area.  This is highly abnormal.  Photodocumentation performed. Next an open-ended catheter was used to very carefully attempt cannulation of the bilateral ureteral orifices.  This was unsuccessful. An  angle-tip Glidewire was used to coil the area of presumed ureteral orifices across the trigone and again this was unsuccessful bilaterally. This was also attempted with a straight tip Sensor wire, also unsuccessful.  Attention was directed at bladder biopsy, resectoscope loop was used to obtain representative biopsies of the area of the bladder neck and base of the prostate.  These were set aside for permanent pathology.  Exquisite care was taken to avoid any monopolar energy in the area which may be even close to the presumed ureteral orifice, end point fulguration current applied to the area of biopsy which revealed excellent hemostasis.  Great care was also taken to avoid resection of Veru.  Exchanged for a 24-French 3-way Foley catheter was placed per urethra.  Next, normal saline irrigation with Efflux essentially clear.  Attention was then directed at prostate biopsy. Transrectal ultrasound was performed which revealed a 51 g very homogeneous prostate.  This was abnormally large for age.  Transrectal ultrasound-guided prostate biopsy was then performed 2 cores right mid gland, 2 cores left mid gland.  Taken exquisite care to avoid any injury to the patient's in situ catheter.  The procedure was then terminated. The patient tolerated the procedure well.  There were no immediate periprocedural complications.  The patient was taken to the postanesthesia care unit in stable condition with plan for emergent interventional radiology consultation for plan for nephrostomy tube.          ______________________________ Sebastian Ache, MD     TM/MEDQ  D:  10/14/2016  T:  10/15/2016  Job:  [id number]

## 2016-10-17 NOTE — Procedures (Signed)
  Pre-operative Diagnosis: Obstructive uropathy with prostate and bladder issues       Post-operative Diagnosis: Obstructive uropathy with prostate and bladder issues   Indications: Needs ureteral stents  Procedure: 1)  Bilateral antegrade nephrostograms 2) Placement of left antegrade ureteral stent 3) Replacement of bilateral nephrostomy tubes 4) Attempted placement of right ureter stent - unsuccessful.  Findings: Occlusion of distal ureters bilaterally.  Placement of left ureter stent (24 Fr).  Unable to pass a wire or catheter beyond the right UVJ.  Complications: None     EBL: Minimal  Plan: Can remove left nephrostomy tube when Foley catheter removed and patient is able to void without difficulty. Can re-attempt right ureter stent placement in 1-2 weeks.

## 2016-10-17 NOTE — Progress Notes (Signed)
Referring Physician(s):  Dr. Sebastian Ache  Supervising Physician: Richarda Overlie  Patient Status:  [facility] - In-pt  Chief Complaint:  Obstructive uropathy due to enlarged prostate  Subjective: Denies pain.  Bilateral PCNs in place. States he has not eaten today.  Allergies: Patient has no known allergies.  Medications: Prior to Admission medications   Medication Sig Start Date End Date Taking? Authorizing Provider  HYDROcodone-acetaminophen (NORCO) 10-325 MG tablet Take 1-2 tablets by mouth every 4 (four) hours as needed for moderate pain. Maximum dose per 24 hours - 8 pills Patient not taking: Reported on 10/14/2016 07/04/16   Ihor Gully, MD  phenazopyridine (PYRIDIUM) 200 MG tablet Take 1 tablet (200 mg total) by mouth 3 (three) times daily as needed for pain. Patient not taking: Reported on 10/14/2016 07/04/16   Ihor Gully, MD     Vital Signs: BP 125/79 (BP Location: Right Arm)   Pulse 81   Temp 98.1 F (36.7 C) (Oral)   Resp 18   SpO2 99%   Physical Exam  Constitutional: He is oriented to person, place, and time. He appears well-developed.  Cardiovascular: Normal rate, regular rhythm and normal heart sounds.   Pulmonary/Chest: Effort normal and breath sounds normal.  Abdominal:  Bilateral PCNs in place. Blood-tinged urine in both bags. Non tender to palpation.  Neurological: He is alert and oriented to person, place, and time.  Skin: Skin is warm and dry.  Psychiatric: He has a normal mood and affect. His behavior is normal. Judgment and thought content normal.  Nursing note and vitals reviewed.    Imaging: [place] Intraoperative  Result Date: 10/14/2016 CLINICAL DATA:  Ultrasound was provided for use by the ordering physician, and a technical charge was applied by the performing facility.  No radiologist interpretation/professional services rendered.   Ct Renal Stone Study  Result Date: 10/14/2016 CLINICAL DATA:  Abdominal pain. History of bladder lesion and  hydronephrosis. EXAM: CT ABDOMEN AND PELVIS WITHOUT CONTRAST TECHNIQUE: Multidetector CT imaging of the abdomen and pelvis was performed following the standard protocol without oral or intravenous contrast material administration. COMPARISON:  June 02, 2016 FINDINGS: Lower chest: Lung bases are clear. Hepatobiliary: No focal liver lesions are appreciable on this noncontrast enhanced study. Gallbladder wall is not appreciably thickened. There is no biliary duct dilatation. Pancreas: No pancreatic mass or inflammatory focus. Spleen: No splenic lesions are evident. Adrenals/Urinary Tract: Adrenals appear unremarkable. There is marked hydronephrosis bilaterally with diffuse ureterectasis to the level of the urinary bladder. There is no renal mass. No calculi noted in either kidney or ureter. The urinary bladder once again shows diffuse wall thickening in a circumferential manner. No well-defined bladder mass is evident. Stomach/Bowel: There is no appreciable bowel wall thickening. There is somewhat generalized mesenteric thickening due to anasarca. There is no bowel obstruction. No free air or portal venous air. Vascular/Lymphatic: No abdominal aortic aneurysm. No vascular lesions are evident. There is no adenopathy in the abdomen or pelvis. Reproductive: Prostate is enlarged for age. Seminal vesicles appear unremarkable. Other: Appendix appears unremarkable. No abscess or ascites evident. Musculoskeletal: There are no blastic or lytic bone lesions. There is generalized anasarca. There are no intramuscular lesions. IMPRESSION: Generalized anasarca, likely due to chronic renal obstruction bilaterally. There is again noted severe hydronephrosis and ureterectasis bilaterally with diffuse urinary bladder wall thickening, likely impressing on the respective ureterovesical junction stone causing hydronephrosis in ureterectasis due to the abnormality in the urinary bladder. There is no well-defined urinary bladder mass.  The appearance  raises question of a chronic inflammatory type lesion causing the generalized bladder wall thickening. It should be noted that neoplastic etiology cannot be excluded. Prostate is enlarged for age. The significance of this finding in light of the other findings is uncertain. There may be a degree of chronic bladder outlet obstruction due to the prostate enlargement. Urologic assessment is advised given these findings. Note that there are no renal or ureteral calculi. Note that the appearance of the prostate, bladder, and kidneys is similar to prior study. No bowel obstruction.  Appendix appears normal.  No adenopathy. Electronically Signed   By: Bretta Bang III M.D.   On: 10/14/2016 15:10   Ir Nephrostomy Placement Left  Result Date: 10/15/2016 INDICATION: 21 year old with acute on chronic renal failure. Bilateral hydronephrosis. EXAM: PLACEMENT OF BILATERAL PERCUTANEOUS NEPHROSTOMY TUBES WITH ULTRASOUND AND FLUOROSCOPIC GUIDANCE COMPARISON:  Abdominal CT 10/14/2016 MEDICATIONS: Ciprofloxacin 400 mg; The antibiotic was administered in an appropriate time frame prior to skin puncture. ANESTHESIA/SEDATION: Fentanyl 75 mcg IV; Versed 5.0 mg IV Moderate Sedation Time:  33 minutes The patient was continuously monitored during the procedure by the interventional radiology nurse under my direct supervision. CONTRAST:  25 mL Isovue-300 - administered into the collecting system(s) FLUOROSCOPY TIME:  Fluoroscopy Time: 2 minutes 48 seconds (15 mGy). COMPLICATIONS: None immediate. PROCEDURE: Informed written consent was obtained from the patient after a thorough discussion of the procedural risks, benefits and alternatives. All questions were addressed. Maximal Sterile Barrier Technique was utilized including caps, mask, sterile gowns, sterile gloves, sterile drape, hand hygiene and skin antiseptic. A timeout was performed prior to the initiation of the procedure. Patient was placed prone. Patient's back  was prepped and draped in sterile fashion. Ultrasound demonstrated severe bilateral hydronephrosis. The left flank was anesthetized with 1% lidocaine. 21 gauge needle directed into a dilated lower pole calyx with ultrasound guidance. Clear urine was draining from the needle hub. A 0.018 wire was advanced into the renal collecting system and a dilator set was placed. Bentson wire was placed. The tract was dilated to accommodate a 10.2 [place] multipurpose drain. Contrast injection confirmed placement in the renal pelvis. Catheter was sutured to skin and attached to gravity bag. The right flank was anesthetized with 1% lidocaine. 21 gauge needle directed into a mid/lower pole calyx with ultrasound guidance. 0.018 wire was advanced into the renal pelvis. Dilator set was placed. Bentson wire was advanced into the collecting system and tract was dilated to accommodate a 10.2 [place] multipurpose drain. Drain was placed in the renal pelvis. Catheter was sutured to skin and attached to gravity bag. Fluoroscopic and ultrasound images were taken and saved for documentation. FINDINGS: Severe bilateral hydronephrosis. Placement of 10.2 French drains in both kidneys. Drains positioned in the renal pelvis bilaterally. IMPRESSION: Successful placement of bilateral percutaneous nephrostomy tubes with ultrasound and fluoroscopic guidance. Electronically Signed   By: Richarda Overlie M.D.   On: 10/15/2016 13:45   Ir Nephrostomy Placement Right  Result Date: 10/15/2016 INDICATION: 21 year old with acute on chronic renal failure. Bilateral hydronephrosis. EXAM: PLACEMENT OF BILATERAL PERCUTANEOUS NEPHROSTOMY TUBES WITH ULTRASOUND AND FLUOROSCOPIC GUIDANCE COMPARISON:  Abdominal CT 10/14/2016 MEDICATIONS: Ciprofloxacin 400 mg; The antibiotic was administered in an appropriate time frame prior to skin puncture. ANESTHESIA/SEDATION: Fentanyl 75 mcg IV; Versed 5.0 mg IV Moderate Sedation Time:  33 minutes The patient was continuously  monitored during the procedure by the interventional radiology nurse under my direct supervision. CONTRAST:  25 mL Isovue-300 - administered into the collecting system(s) FLUOROSCOPY TIME:  Fluoroscopy Time: 2 minutes 48 seconds (15 mGy). COMPLICATIONS: None immediate. PROCEDURE: Informed written consent was obtained from the patient after a thorough discussion of the procedural risks, benefits and alternatives. All questions were addressed. Maximal Sterile Barrier Technique was utilized including caps, mask, sterile gowns, sterile gloves, sterile drape, hand hygiene and skin antiseptic. A timeout was performed prior to the initiation of the procedure. Patient was placed prone. Patient's back was prepped and draped in sterile fashion. Ultrasound demonstrated severe bilateral hydronephrosis. The left flank was anesthetized with 1% lidocaine. 21 gauge needle directed into a dilated lower pole calyx with ultrasound guidance. Clear urine was draining from the needle hub. A 0.018 wire was advanced into the renal collecting system and a dilator set was placed. Bentson wire was placed. The tract was dilated to accommodate a 10.2 [place] multipurpose drain. Contrast injection confirmed placement in the renal pelvis. Catheter was sutured to skin and attached to gravity bag. The right flank was anesthetized with 1% lidocaine. 21 gauge needle directed into a mid/lower pole calyx with ultrasound guidance. 0.018 wire was advanced into the renal pelvis. Dilator set was placed. Bentson wire was advanced into the collecting system and tract was dilated to accommodate a 10.2 [place] multipurpose drain. Drain was placed in the renal pelvis. Catheter was sutured to skin and attached to gravity bag. Fluoroscopic and ultrasound images were taken and saved for documentation. FINDINGS: Severe bilateral hydronephrosis. Placement of 10.2 French drains in both kidneys. Drains positioned in the renal pelvis bilaterally. IMPRESSION: Successful  placement of bilateral percutaneous nephrostomy tubes with ultrasound and fluoroscopic guidance. Electronically Signed   By: Richarda Overlie M.D.   On: 10/15/2016 13:45    Labs:  CBC:  Recent Labs  07/04/16 0945 07/04/16 1047 10/14/16 1339 10/16/16 0533  WBC  --   --  5.6 7.9  HGB 15.0 15.6 9.8* 8.6*  HCT  --  46.0 27.6* 24.8*  PLT  --   --  232 234    COAGS:  Recent Labs  10/15/16 0923  INR 1.24  APTT 29    BMP:  Recent Labs  10/15/16 0649 10/15/16 1327 10/16/16 0533 10/17/16 0508  NA 138 140 141 136  K 7.5* 6.3* 4.5 4.4  CL 110 111 108 102  CO2 15* 18* 23 26  GLUCOSE 128* 112* 103* 94  BUN 114* 116* 56* 21*  CALCIUM 9.0 9.5 8.7* 8.5*  CREATININE 24.00* 22.39* 7.09* 2.09*  GFRNONAA 2* 2* 10* 44*  GFRAA 3* 3* 12* 51*    LIVER FUNCTION TESTS:  Recent Labs  10/15/16 0649 10/16/16 0533  BILITOT  --  0.7  AST  --  17  ALT  --  18  ALKPHOS  --  54  PROT  --  7.2  ALBUMIN 3.3* 3.8    Assessment and Plan: 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 Prostate mass s/p bilateral nephrostomy tube placement 10/15/16. Patient is doing well s/p bilateral PCN placement.  He has had good UOP from both drains.  His SCr has improved to 7.09.  IR consulted for internalization with double J stents and removal of nephrostomy tubes. Case discussed between Dr. Vernie Ammons and Dr. Lowella Dandy.  Patient has been NPO this AM.  He has not received any blood thinners.  Anticipate procedure today.  Risks and benefits discussed with the patient including, but not limited to infection, bleeding, significant bleeding causing loss or decrease in renal function or damage to adjacent structures.  All of the patient's questions  were answered, patient is agreeable to proceed. Consent signed and in chart.  Electronically Signed: Hoyt Koch 10/17/2016, 12:15 PM   I spent a total of 15 Minutes at the the patient's bedside AND on the patient's hospital floor or unit, greater  than 50% of which was counseling/coordinating care for obstructive uropathy.

## 2016-10-17 NOTE — Progress Notes (Signed)
Subjective: Good UOP, 6L in and 4L out, Left PCN output > R PCN  Objective: Vital signs in last 24 hours: Temp:  [98.1 F (36.7 C)-98.2 F (36.8 C)] 98.1 F (36.7 C) (04/02 0537) Pulse Rate:  [80-99] 90 (04/02 1435) Resp:  [11-22] 14 (04/02 1435) BP: (124-152)/(69-100) 137/84 (04/02 1435) SpO2:  [98 %-100 %] 100 % (04/02 1435) Weight change:   Intake/Output from previous day: 04/01 0701 - 04/02 0700 In: 6397.5 [P.O.:1560; I.V.:3887.5] Out: 4076 [Urine:4075; Stool:1] Intake/Output this shift: Total I/O In: -  Out: 1050 [Urine:1050]  Exam: General appearance: alert and cooperative Resp: clear to auscultation bilaterally Cardio: regular rate and rhythm, S1, S2 normal, no murmur, click, rub or gallop Extremities: no edema Skin: Skin color, texture, turgor normal. No rashes or lesions  Assessment: 1 AKI due to obstructive uropathy due to bladder/prostate mass -  improving function, creat down 22 > 7 > 2.0, since placement of bilat PCN tubes.  R kidney output is low at 500 cc/ day.  Continue IVF at 250 / hr for now.  2 Hyperkalemia due to #1 - resolved 4 Chronic bilateral hydronephrosis-s/p bilat PCNs w/ with high output 5 Bladder/prostate mass of unknown cause - bx results should be pending 6. Metabolic acidosis due to #1 - resolved  Plan: continue IVF's at 250/ hr, f/u creat in am.      Lab Results:  Recent Labs  10/16/16 0533  WBC 7.9  HGB 8.6*  HCT 24.8*  PLT 234   BMET:   Recent Labs  10/16/16 0533 10/17/16 0508  NA 141 136  K 4.5 4.4  CL 108 102  CO2 23 26  GLUCOSE 103* 94  BUN 56* 21*  CREATININE 7.09* 2.09*  CALCIUM 8.7* 8.5*   No results for input(s): PTH in the last 72 hours. Iron Studies: No results for input(s): IRON, TIBC, TRANSFERRIN, FERRITIN in the last 72 hours. Studies/Results: No results found.  Scheduled: . ciprofloxacin  400 mg Intravenous On Call  . lidocaine      . senna-docusate  1 tablet Oral BID     LOS: 3 days    Alexander Sims D 10/17/2016,2:40 PM

## 2016-10-18 ENCOUNTER — Encounter (HOSPITAL_COMMUNITY): Payer: Self-pay | Admitting: Interventional Radiology

## 2016-10-18 ENCOUNTER — Inpatient Hospital Stay (HOSPITAL_COMMUNITY): Payer: BLUE CROSS/BLUE SHIELD

## 2016-10-18 HISTORY — PX: IR NEPHROSTOGRAM LEFT THRU EXISTING ACCESS: IMG6061

## 2016-10-18 LAB — BASIC METABOLIC PANEL
Anion gap: 7 (ref 5–15)
BUN: 17 mg/dL (ref 6–20)
CALCIUM: 7.9 mg/dL — AB (ref 8.9–10.3)
CO2: 27 mmol/L (ref 22–32)
CREATININE: 1.49 mg/dL — AB (ref 0.61–1.24)
Chloride: 103 mmol/L (ref 101–111)
GFR calc Af Amer: >60 mL/min (ref >60)
GLUCOSE: 94 mg/dL (ref 65–99)
Potassium: 4 mmol/L (ref 3.5–5.1)
SODIUM: 137 mmol/L (ref 135–145)

## 2016-10-18 MED ORDER — HYDROCODONE-ACETAMINOPHEN 10-325 MG PO TABS: 1.0000 | ORAL_TABLET | ORAL | 0 refills | Status: AC | PRN

## 2016-10-18 MED ORDER — IOPAMIDOL (ISOVUE-300) INJECTION 61%
INTRAVENOUS | Status: AC
  Administered 2016-10-18: 20 mL
  Filled 2016-10-18: qty 50

## 2016-10-18 NOTE — Discharge Summary (Signed)
Physician Discharge Summary  Patient ID: Alexander Sims MRN: [id number] DOB/AGE: 21/11/1995 21 y.o.  Admit date: 10/14/2016 Discharge date: 10/18/2016  Admission Diagnoses: Hx of prostate biopsy [Z98.890]  Discharge Diagnoses:  Active Problems:   Acute renal failure (ARF) (HCC)  Bilateral ureteral obstruction Cystitis glandularis  Discharged Condition: good  Hospital Course:  He was seen in my office and was feeling poorly. He continued to demonstrate bilateral hydronephrosis and lab work was done which revealed a creatinine of greater than 20. He presented to the hospital for admission and underwent an attempt at retrograde stent placement. At the time of cystoscopy was found to have significant edematous changes as were noted previously at cystoscopy. This was again resected for tissue to be evaluated pathologically. In addition, because his CT scan had revealed an enlarged prostate greater than expected for his age, he underwent a prostate biopsy. Retrograde stent placement was not possible. He therefore underwent bilateral percutaneous nephrostomy tube placement. This resulted in good output with much greater output from the left side than the right.   His creatinine fell dramatically with drainage and at the time of discharge has returned to 1.49. He did have hyperkalemia initially which was managed and his potassium now remains normal as well. An attempt to internalize his nephrostomy tubes with antegrade double-J stents was undertaken yesterday. The left side revealed a tortuous ureter however access was achieved and a double-J stent was placed on the left-hand side in the nephrostomy tube was removed. On the right-hand side there was high-grade obstruction at the level of the bladder. This is the side that had poor urine output throughout his hospitalization and this would appear to indicate he has had longer and more complete obstruction of the right-hand side as he was making urine up until the  time of his hospital admission. A nephrostomy tube was left on the right-hand side. An attempt at antegrade placement will be undertaken in about 2 weeks.  His pathology revealed cystitis glandularis from the bladder neck region however all prostate biopsies revealed BPH only. No evidence of cancer was identified. I discussed with the patient and attempt to manage this inflammatory type condition with steroids and will place him on a steroid Dosepak upon discharge. He was supposed to be seen at [facility] for a second opinion on 10/17/16 and I will reschedule this as well. At this time he is feeling much better and therefore his Foley catheter will be removed and he will be discharged home.  Significant Diagnostic Studies: Ir Nephrostomy Exchange Left  Result Date: 10/17/2016 INDICATION: 21 year old with obstructive uropathy. Patient's kidney function has markedly improved after placement of bilateral nephrostomy tubes. Patient has bladder and prostate thickening. Request for placement of antegrade ureter stents. EXAM: EXCHANGE OF BILATERAL NEPHROSTOMY TUBES WITH FLUOROSCOPY PLACEMENT OF ANTEGRADE LEFT URETER STENT WITH FLUOROSCOPY ATTEMPTED PLACEMENT OF AN ANTEGRADE RIGHT URETER STENT WITH FLUOROSCOPY COMPARISON:  10/15/2016 MEDICATIONS: Ciprofloxacin 400 mg; The antibiotic was administered in an appropriate time frame prior to skin puncture. ANESTHESIA/SEDATION: Fentanyl 100 mcg IV; Versed 6.0 mg IV Moderate Sedation Time:  60 minutes The patient was continuously monitored during the procedure by the interventional radiology nurse under my direct supervision. CONTRAST:  40 mL Isovue-300 - administered into the collecting system(s) FLUOROSCOPY TIME:  Fluoroscopy Time: 19 minutes and 48 seconds, 96 mGy COMPLICATIONS: None immediate. PROCEDURE: Informed written consent was obtained from the patient after a thorough discussion of the procedural risks, benefits and alternatives. All questions were addressed. Maximal  Sterile Barrier  Technique was utilized including caps, mask, sterile gowns, sterile gloves, sterile drape, hand hygiene and skin antiseptic. A timeout was performed prior to the initiation of the procedure. Both flanks were prepped and draped in a sterile fashion. Contrast was injected through the left nephrostomy tube. Catheter was cut and removed over a Bentson wire. A 5 [place] Kumpe catheter was advanced down the left ureter with some difficulty. The left ureter is very tortuous. A Roadrunner wire was used to advance the catheter. The 5 French catheter was exchanged for a 4 [place] glide catheter. Four French glide catheter and Roadrunner wire were successfully advanced across the left ureterovesical junction. Contrast injection confirmed placement in the bladder. Stiff Amplatz wire was placed. A 7 French vascular sheath was placed and a Bentson wire was placed as a safety wire. An 8 French 24 cm ureter catheter was advanced over the Amplatz wire and successfully deployed in the left ureter. Proximal aspect was placed in the renal pelvis and the distal aspect was placed in the urinary bladder. A 10 French Dawson Mueller catheter was advanced over the Bentson wire reconstituted in the left renal pelvis. Follow-up left nephrostogram was performed. Skin was anesthetized with 1% lidocaine and the catheter was sutured to skin. Attention was directed to the right kidney. Catheter was injected with contrast. Catheter was cut and removed over a Bentson wire. 5 [place] Kumpe the catheter was successfully advanced to the distal right ureter with a Roadrunner wire. This wire would not advance into the urinary bladder. Catheter was exchanged for the Glide catheter. Wire and catheter could not be successfully advanced into the bladder. Therefore, the catheter was removed over a Bentson wire and a new 10.2 [place] multipurpose drain was reconstituted in the right renal pelvis. Skin was anesthetized with 1% lidocaine and the  catheter was sutured to the skin. Right nephrostomy tube was attached to gravity bag. Left nephrostomy tube was capped. FINDINGS: Left ureter was mildly dilated and very tortuous. Obstruction at the left ureterovesical junction. Catheter and wire were successfully advanced beyond the left ureterovesical junction. A double-J left ureter stent was successfully placed. Left ureter stent was patent at the end of the procedure. Mild tortuosity of the right ureter. Complete obstruction of the distal right ureter at the right ureterovesical junction. Catheter or wire could not be advanced beyond the right ureterovesical junction. Therefore, a new right nephrostomy tube was placed. 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 Successful placement of an antegrade left ureter stent. Unable to pass a catheter or wire beyond the obstruction in the distal right ureter. Therefore, a new right percutaneous nephrostomy tube was placed. We can attempt another right ureter stent in 1-2 weeks. Left percutaneous nephrostomy tube is still present. The tube is currently capped because the left ureter stent is working properly. Plan to remove this tube when the patient's Foley catheter has been removed and he is voiding normally. Electronically Signed   By: Richarda Overlie M.D.   On: 10/17/2016 18:23   Ir Nephrostomy Exchange Right  Result Date: 10/17/2016 INDICATION: 21 year old with obstructive uropathy. Patient's kidney function has markedly improved after placement of bilateral nephrostomy tubes. Patient has bladder and prostate thickening. Request for placement of antegrade ureter stents. EXAM: EXCHANGE OF BILATERAL NEPHROSTOMY TUBES WITH FLUOROSCOPY PLACEMENT OF ANTEGRADE LEFT URETER STENT WITH FLUOROSCOPY ATTEMPTED PLACEMENT OF AN ANTEGRADE RIGHT URETER STENT WITH FLUOROSCOPY COMPARISON:  10/15/2016 MEDICATIONS: Ciprofloxacin 400 mg; The antibiotic was administered in an appropriate time  frame prior to skin puncture. ANESTHESIA/SEDATION: Fentanyl 100 mcg IV; Versed 6.0 mg IV Moderate Sedation Time:  60 minutes The patient was continuously monitored during the procedure by the interventional radiology nurse under my direct supervision. CONTRAST:  40 mL Isovue-300 - administered into the collecting system(s) FLUOROSCOPY TIME:  Fluoroscopy Time: 19 minutes and 48 seconds, 96 mGy COMPLICATIONS: None immediate. PROCEDURE: Informed written consent was obtained from the patient after a thorough discussion of the procedural risks, benefits and alternatives. All questions were addressed. Maximal Sterile Barrier Technique was utilized including caps, mask, sterile gowns, sterile gloves, sterile drape, hand hygiene and skin antiseptic. A timeout was performed prior to the initiation of the procedure. Both flanks were prepped and draped in a sterile fashion. Contrast was injected through the left nephrostomy tube. Catheter was cut and removed over a Bentson wire. A 5 [place] Kumpe catheter was advanced down the left ureter with some difficulty. The left ureter is very tortuous. A Roadrunner wire was used to advance the catheter. The 5 French catheter was exchanged for a 4 [place] glide catheter. Four French glide catheter and Roadrunner wire were successfully advanced across the left ureterovesical junction. Contrast injection confirmed placement in the bladder. Stiff Amplatz wire was placed. A 7 French vascular sheath was placed and a Bentson wire was placed as a safety wire. An 8 French 24 cm ureter catheter was advanced over the Amplatz wire and successfully deployed in the left ureter. Proximal aspect was placed in the renal pelvis and the distal aspect was placed in the urinary bladder. A 10 French Dawson Mueller catheter was advanced over the Bentson wire reconstituted in the left renal pelvis. Follow-up left nephrostogram was performed. Skin was anesthetized with 1% lidocaine and the catheter was sutured  to skin. Attention was directed to the right kidney. Catheter was injected with contrast. Catheter was cut and removed over a Bentson wire. 5 [place] Kumpe the catheter was successfully advanced to the distal right ureter with a Roadrunner wire. This wire would not advance into the urinary bladder. Catheter was exchanged for the Glide catheter. Wire and catheter could not be successfully advanced into the bladder. Therefore, the catheter was removed over a Bentson wire and a new 10.2 [place] multipurpose drain was reconstituted in the right renal pelvis. Skin was anesthetized with 1% lidocaine and the catheter was sutured to the skin. Right nephrostomy tube was attached to gravity bag. Left nephrostomy tube was capped. FINDINGS: Left ureter was mildly dilated and very tortuous. Obstruction at the left ureterovesical junction. Catheter and wire were successfully advanced beyond the left ureterovesical junction. A double-J left ureter stent was successfully placed. Left ureter stent was patent at the end of the procedure. Mild tortuosity of the right ureter. Complete obstruction of the distal right ureter at the right ureterovesical junction. Catheter or wire could not be advanced beyond the right ureterovesical junction. Therefore, a new right nephrostomy tube was placed. 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 Successful placement of an antegrade left ureter stent. Unable to pass a catheter or wire beyond the obstruction in the distal right ureter. Therefore, a new right percutaneous nephrostomy tube was placed. We can attempt another right ureter stent in 1-2 weeks. Left percutaneous nephrostomy tube is still present. The tube is currently capped because the left ureter stent is working properly. Plan to remove this tube when the patient's Foley catheter has been removed and he is voiding normally. Electronically Signed   By: Madelaine Bhat  Lowella Dandy M.D.   On: 10/17/2016 18:23    Ir Ureteral Stent Placement Existing Access Left  Result Date: 10/17/2016 INDICATION: 21 year old with obstructive uropathy. Patient's kidney function has markedly improved after placement of bilateral nephrostomy tubes. Patient has bladder and prostate thickening. Request for placement of antegrade ureter stents. EXAM: EXCHANGE OF BILATERAL NEPHROSTOMY TUBES WITH FLUOROSCOPY PLACEMENT OF ANTEGRADE LEFT URETER STENT WITH FLUOROSCOPY ATTEMPTED PLACEMENT OF AN ANTEGRADE RIGHT URETER STENT WITH FLUOROSCOPY COMPARISON:  10/15/2016 MEDICATIONS: Ciprofloxacin 400 mg; The antibiotic was administered in an appropriate time frame prior to skin puncture. ANESTHESIA/SEDATION: Fentanyl 100 mcg IV; Versed 6.0 mg IV Moderate Sedation Time:  60 minutes The patient was continuously monitored during the procedure by the interventional radiology nurse under my direct supervision. CONTRAST:  40 mL Isovue-300 - administered into the collecting system(s) FLUOROSCOPY TIME:  Fluoroscopy Time: 19 minutes and 48 seconds, 96 mGy COMPLICATIONS: None immediate. PROCEDURE: Informed written consent was obtained from the patient after a thorough discussion of the procedural risks, benefits and alternatives. All questions were addressed. Maximal Sterile Barrier Technique was utilized including caps, mask, sterile gowns, sterile gloves, sterile drape, hand hygiene and skin antiseptic. A timeout was performed prior to the initiation of the procedure. Both flanks were prepped and draped in a sterile fashion. Contrast was injected through the left nephrostomy tube. Catheter was cut and removed over a Bentson wire. A 5 [place] Kumpe catheter was advanced down the left ureter with some difficulty. The left ureter is very tortuous. A Roadrunner wire was used to advance the catheter. The 5 French catheter was exchanged for a 4 [place] glide catheter. Four French glide catheter and Roadrunner wire were successfully advanced across the left  ureterovesical junction. Contrast injection confirmed placement in the bladder. Stiff Amplatz wire was placed. A 7 French vascular sheath was placed and a Bentson wire was placed as a safety wire. An 8 French 24 cm ureter catheter was advanced over the Amplatz wire and successfully deployed in the left ureter. Proximal aspect was placed in the renal pelvis and the distal aspect was placed in the urinary bladder. A 10 French Dawson Mueller catheter was advanced over the Bentson wire reconstituted in the left renal pelvis. Follow-up left nephrostogram was performed. Skin was anesthetized with 1% lidocaine and the catheter was sutured to skin. Attention was directed to the right kidney. Catheter was injected with contrast. Catheter was cut and removed over a Bentson wire. 5 [place] Kumpe the catheter was successfully advanced to the distal right ureter with a Roadrunner wire. This wire would not advance into the urinary bladder. Catheter was exchanged for the Glide catheter. Wire and catheter could not be successfully advanced into the bladder. Therefore, the catheter was removed over a Bentson wire and a new 10.2 [place] multipurpose drain was reconstituted in the right renal pelvis. Skin was anesthetized with 1% lidocaine and the catheter was sutured to the skin. Right nephrostomy tube was attached to gravity bag. Left nephrostomy tube was capped. FINDINGS: Left ureter was mildly dilated and very tortuous. Obstruction at the left ureterovesical junction. Catheter and wire were successfully advanced beyond the left ureterovesical junction. A double-J left ureter stent was successfully placed. Left ureter stent was patent at the end of the procedure. Mild tortuosity of the right ureter. Complete obstruction of the distal right ureter at the right ureterovesical junction. Catheter or wire could not be advanced beyond the right ureterovesical junction. Therefore, a new right nephrostomy tube was placed. IMPRESSION:  Bilateral obstruction of  the distal ureters at the ureterovesical junctions. Successful placement of an antegrade left ureter stent. Unable to pass a catheter or wire beyond the obstruction in the distal right ureter. Therefore, a new right percutaneous nephrostomy tube was placed. We can attempt another right ureter stent in 1-2 weeks. Left percutaneous nephrostomy tube is still present. The tube is currently capped because the left ureter stent is working properly. Plan to remove this tube when the patient's Foley catheter has been removed and he is voiding normally. Electronically Signed   By: Richarda Overlie M.D.   On: 10/17/2016 18:23    Discharge Exam: Blood pressure 137/88, pulse 91, temperature 98.3 F (36.8 C), temperature source Oral, resp. rate 18, SpO2 100 %. Alert, awake and in no para-distress. Normal respiratory effort Cardiovascular regular rate and rhythm Abdomen soft and nontender Right nephrostomy tube in place draining clear urine. Foley catheter draining clear urine.  Disposition: 01-Home or Self Care   Allergies as of 10/18/2016   No Known Allergies     Medication List    TAKE these medications   HYDROcodone-acetaminophen 10-325 MG tablet Commonly known as:  NORCO Take 1-2 tablets by mouth every 4 (four) hours as needed for moderate pain. Maximum dose per 24 hours - 8 pills What changed:  Another medication with the same name was added. Make sure you understand how and when to take each.   HYDROcodone-acetaminophen 10-325 MG tablet Commonly known as:  NORCO Take 1-2 tablets by mouth every 4 (four) hours as needed for moderate pain. Maximum dose per 24 hours - 8 pills What changed:  You were already taking a medication with the same name, and this prescription was added. Make sure you understand how and when to take each.   phenazopyridine 200 MG tablet Commonly known as:  PYRIDIUM Take 1 tablet (200 mg total) by mouth 3 (three) times daily as needed for pain.       Follow-up Information    [facility]   Why:  Our office will set up an appointment fo you to try and get the other tube out. Contact information: [address]  [place] [address] [place] [address] [phone]           Signed: [place]ett [place] 10/18/2016, 6:16 AM

## 2016-10-18 NOTE — Progress Notes (Signed)
Patient educated on how to flush L nephrostomy. Verbalized understanding. MD had written on prescription that he was going to call in a steroid to the patient's pharmacy. Patient said he uses [facility] on [address]. RN called [facility] pharmacy but no prescription was on hold there. Instructed patient to call Urology office in the morning to clarify prescription.

## 2016-10-18 NOTE — Progress Notes (Signed)
Subjective: had L stent internalized, unable to get JJ stent in place on the R.  To have L PCN removed today and is for dc home. Creat down to 1.5 today.  R kidney not making much urine still.    Exam: General appearance: alert and cooperative Resp: clear to auscultation bilaterally Cardio: regular rate and rhythm, S1, S2 normal, no murmur, click, rub or gallop Extremities: no edema Skin: Skin color, texture, turgor normal. No rashes or lesions  Assessment: 1 AKI due to obstructive uropathy - much better after bilat PCN's.  R kidney may be damaged, L kidney putting out lots of urine.  Plans per urology, will sign off.   2 Hyperkalemia due to #1 - resolved 3 Metabolic acidosis due to #1 - resolved  Plan: as above    Lab Results:  Recent Labs  10/16/16 0533  WBC 7.9  HGB 8.6*  HCT 24.8*  PLT 234   BMET:   Recent Labs  10/17/16 0508 10/18/16 0513  NA 136 137  K 4.4 4.0  CL 102 103  CO2 26 27  GLUCOSE 94 94  BUN 21* 17  CREATININE 2.09* 1.49*  CALCIUM 8.5* 7.9*   No results for input(s): PTH in the last 72 hours. Iron Studies: No results for input(s): IRON, TIBC, TRANSFERRIN, FERRITIN in the last 72 hours. Studies/Results: Ir Nephrostomy Exchange Left  Result Date: 10/17/2016 INDICATION: 21 year old with obstructive uropathy. Patient's kidney function has markedly improved after placement of bilateral nephrostomy tubes. Patient has bladder and prostate thickening. Request for placement of antegrade ureter stents. EXAM: EXCHANGE OF BILATERAL NEPHROSTOMY TUBES WITH FLUOROSCOPY PLACEMENT OF ANTEGRADE LEFT URETER STENT WITH FLUOROSCOPY ATTEMPTED PLACEMENT OF AN ANTEGRADE RIGHT URETER STENT WITH FLUOROSCOPY COMPARISON:  10/15/2016 MEDICATIONS: Ciprofloxacin 400 mg; The antibiotic was administered in an appropriate time frame prior to skin puncture. ANESTHESIA/SEDATION: Fentanyl 100 mcg IV; Versed 6.0 mg IV Moderate Sedation Time:  60 minutes The patient was continuously  monitored during the procedure by the interventional radiology nurse under my direct supervision. CONTRAST:  40 mL Isovue-300 - administered into the collecting system(s) FLUOROSCOPY TIME:  Fluoroscopy Time: 19 minutes and 48 seconds, 96 mGy COMPLICATIONS: None immediate. PROCEDURE: Informed written consent was obtained from the patient after a thorough discussion of the procedural risks, benefits and alternatives. All questions were addressed. Maximal Sterile Barrier Technique was utilized including caps, mask, sterile gowns, sterile gloves, sterile drape, hand hygiene and skin antiseptic. A timeout was performed prior to the initiation of the procedure. Both flanks were prepped and draped in a sterile fashion. Contrast was injected through the left nephrostomy tube. Catheter was cut and removed over a Bentson wire. A 5 [place] Kumpe catheter was advanced down the left ureter with some difficulty. The left ureter is very tortuous. A Roadrunner wire was used to advance the catheter. The 5 French catheter was exchanged for a 4 [place] glide catheter. Four French glide catheter and Roadrunner wire were successfully advanced across the left ureterovesical junction. Contrast injection confirmed placement in the bladder. Stiff Amplatz wire was placed. A 7 French vascular sheath was placed and a Bentson wire was placed as a safety wire. An 8 French 24 cm ureter catheter was advanced over the Amplatz wire and successfully deployed in the left ureter. Proximal aspect was placed in the renal pelvis and the distal aspect was placed in the urinary bladder. A 10 French Dawson Mueller catheter was advanced over the Bentson wire reconstituted in the left renal pelvis. Follow-up left nephrostogram  was performed. Skin was anesthetized with 1% lidocaine and the catheter was sutured to skin. Attention was directed to the right kidney. Catheter was injected with contrast. Catheter was cut and removed over a Bentson wire. 5 [place] Kumpe  the catheter was successfully advanced to the distal right ureter with a Roadrunner wire. This wire would not advance into the urinary bladder. Catheter was exchanged for the Glide catheter. Wire and catheter could not be successfully advanced into the bladder. Therefore, the catheter was removed over a Bentson wire and a new 10.2 [place] multipurpose drain was reconstituted in the right renal pelvis. Skin was anesthetized with 1% lidocaine and the catheter was sutured to the skin. Right nephrostomy tube was attached to gravity bag. Left nephrostomy tube was capped. FINDINGS: Left ureter was mildly dilated and very tortuous. Obstruction at the left ureterovesical junction. Catheter and wire were successfully advanced beyond the left ureterovesical junction. A double-J left ureter stent was successfully placed. Left ureter stent was patent at the end of the procedure. Mild tortuosity of the right ureter. Complete obstruction of the distal right ureter at the right ureterovesical junction. Catheter or wire could not be advanced beyond the right ureterovesical junction. Therefore, a new right nephrostomy tube was placed. 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 Successful placement of an antegrade left ureter stent. Unable to pass a catheter or wire beyond the obstruction in the distal right ureter. Therefore, a new right percutaneous nephrostomy tube was placed. We can attempt another right ureter stent in 1-2 weeks. Left percutaneous nephrostomy tube is still present. The tube is currently capped because the left ureter stent is working properly. Plan to remove this tube when the patient's Foley catheter has been removed and he is voiding normally. Electronically Signed   By: Richarda Overlie M.D.   On: 10/17/2016 18:23   Ir Nephrostomy Exchange Right  Result Date: 10/17/2016 INDICATION: 21 year old with obstructive uropathy. Patient's kidney function has markedly improved  after placement of bilateral nephrostomy tubes. Patient has bladder and prostate thickening. Request for placement of antegrade ureter stents. EXAM: EXCHANGE OF BILATERAL NEPHROSTOMY TUBES WITH FLUOROSCOPY PLACEMENT OF ANTEGRADE LEFT URETER STENT WITH FLUOROSCOPY ATTEMPTED PLACEMENT OF AN ANTEGRADE RIGHT URETER STENT WITH FLUOROSCOPY COMPARISON:  10/15/2016 MEDICATIONS: Ciprofloxacin 400 mg; The antibiotic was administered in an appropriate time frame prior to skin puncture. ANESTHESIA/SEDATION: Fentanyl 100 mcg IV; Versed 6.0 mg IV Moderate Sedation Time:  60 minutes The patient was continuously monitored during the procedure by the interventional radiology nurse under my direct supervision. CONTRAST:  40 mL Isovue-300 - administered into the collecting system(s) FLUOROSCOPY TIME:  Fluoroscopy Time: 19 minutes and 48 seconds, 96 mGy COMPLICATIONS: None immediate. PROCEDURE: Informed written consent was obtained from the patient after a thorough discussion of the procedural risks, benefits and alternatives. All questions were addressed. Maximal Sterile Barrier Technique was utilized including caps, mask, sterile gowns, sterile gloves, sterile drape, hand hygiene and skin antiseptic. A timeout was performed prior to the initiation of the procedure. Both flanks were prepped and draped in a sterile fashion. Contrast was injected through the left nephrostomy tube. Catheter was cut and removed over a Bentson wire. A 5 [place] Kumpe catheter was advanced down the left ureter with some difficulty. The left ureter is very tortuous. A Roadrunner wire was used to advance the catheter. The 5 French catheter was exchanged for a 4 [place] glide catheter. Four French glide catheter and Roadrunner wire were successfully advanced across the left  ureterovesical junction. Contrast injection confirmed placement in the bladder. Stiff Amplatz wire was placed. A 7 French vascular sheath was placed and a Bentson wire was placed as a safety  wire. An 8 French 24 cm ureter catheter was advanced over the Amplatz wire and successfully deployed in the left ureter. Proximal aspect was placed in the renal pelvis and the distal aspect was placed in the urinary bladder. A 10 French Dawson Mueller catheter was advanced over the Bentson wire reconstituted in the left renal pelvis. Follow-up left nephrostogram was performed. Skin was anesthetized with 1% lidocaine and the catheter was sutured to skin. Attention was directed to the right kidney. Catheter was injected with contrast. Catheter was cut and removed over a Bentson wire. 5 [place] Kumpe the catheter was successfully advanced to the distal right ureter with a Roadrunner wire. This wire would not advance into the urinary bladder. Catheter was exchanged for the Glide catheter. Wire and catheter could not be successfully advanced into the bladder. Therefore, the catheter was removed over a Bentson wire and a new 10.2 [place] multipurpose drain was reconstituted in the right renal pelvis. Skin was anesthetized with 1% lidocaine and the catheter was sutured to the skin. Right nephrostomy tube was attached to gravity bag. Left nephrostomy tube was capped. FINDINGS: Left ureter was mildly dilated and very tortuous. Obstruction at the left ureterovesical junction. Catheter and wire were successfully advanced beyond the left ureterovesical junction. A double-J left ureter stent was successfully placed. Left ureter stent was patent at the end of the procedure. Mild tortuosity of the right ureter. Complete obstruction of the distal right ureter at the right ureterovesical junction. Catheter or wire could not be advanced beyond the right ureterovesical junction. Therefore, a new right nephrostomy tube was placed. 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 Successful placement of an antegrade left ureter stent. Unable to pass a catheter or wire beyond the obstruction in the  distal right ureter. Therefore, a new right percutaneous nephrostomy tube was placed. We can attempt another right ureter stent in 1-2 weeks. Left percutaneous nephrostomy tube is still present. The tube is currently capped because the left ureter stent is working properly. Plan to remove this tube when the patient's Foley catheter has been removed and he is voiding normally. Electronically Signed   By: Richarda Overlie M.D.   On: 10/17/2016 18:23   Ir Ureteral Stent Placement Existing Access Left  Result Date: 10/17/2016 INDICATION: 21 year old with obstructive uropathy. Patient's kidney function has markedly improved after placement of bilateral nephrostomy tubes. Patient has bladder and prostate thickening. Request for placement of antegrade ureter stents. EXAM: EXCHANGE OF BILATERAL NEPHROSTOMY TUBES WITH FLUOROSCOPY PLACEMENT OF ANTEGRADE LEFT URETER STENT WITH FLUOROSCOPY ATTEMPTED PLACEMENT OF AN ANTEGRADE RIGHT URETER STENT WITH FLUOROSCOPY COMPARISON:  10/15/2016 MEDICATIONS: Ciprofloxacin 400 mg; The antibiotic was administered in an appropriate time frame prior to skin puncture. ANESTHESIA/SEDATION: Fentanyl 100 mcg IV; Versed 6.0 mg IV Moderate Sedation Time:  60 minutes The patient was continuously monitored during the procedure by the interventional radiology nurse under my direct supervision. CONTRAST:  40 mL Isovue-300 - administered into the collecting system(s) FLUOROSCOPY TIME:  Fluoroscopy Time: 19 minutes and 48 seconds, 96 mGy COMPLICATIONS: None immediate. PROCEDURE: Informed written consent was obtained from the patient after a thorough discussion of the procedural risks, benefits and alternatives. All questions were addressed. Maximal Sterile Barrier Technique was utilized including caps, mask, sterile gowns, sterile gloves, sterile drape, hand hygiene and skin  antiseptic. A timeout was performed prior to the initiation of the procedure. Both flanks were prepped and draped in a sterile fashion.  Contrast was injected through the left nephrostomy tube. Catheter was cut and removed over a Bentson wire. A 5 [place] Kumpe catheter was advanced down the left ureter with some difficulty. The left ureter is very tortuous. A Roadrunner wire was used to advance the catheter. The 5 French catheter was exchanged for a 4 [place] glide catheter. Four French glide catheter and Roadrunner wire were successfully advanced across the left ureterovesical junction. Contrast injection confirmed placement in the bladder. Stiff Amplatz wire was placed. A 7 French vascular sheath was placed and a Bentson wire was placed as a safety wire. An 8 French 24 cm ureter catheter was advanced over the Amplatz wire and successfully deployed in the left ureter. Proximal aspect was placed in the renal pelvis and the distal aspect was placed in the urinary bladder. A 10 French Dawson Mueller catheter was advanced over the Bentson wire reconstituted in the left renal pelvis. Follow-up left nephrostogram was performed. Skin was anesthetized with 1% lidocaine and the catheter was sutured to skin. Attention was directed to the right kidney. Catheter was injected with contrast. Catheter was cut and removed over a Bentson wire. 5 [place] Kumpe the catheter was successfully advanced to the distal right ureter with a Roadrunner wire. This wire would not advance into the urinary bladder. Catheter was exchanged for the Glide catheter. Wire and catheter could not be successfully advanced into the bladder. Therefore, the catheter was removed over a Bentson wire and a new 10.2 [place] multipurpose drain was reconstituted in the right renal pelvis. Skin was anesthetized with 1% lidocaine and the catheter was sutured to the skin. Right nephrostomy tube was attached to gravity bag. Left nephrostomy tube was capped. FINDINGS: Left ureter was mildly dilated and very tortuous. Obstruction at the left ureterovesical junction. Catheter and wire were successfully  advanced beyond the left ureterovesical junction. A double-J left ureter stent was successfully placed. Left ureter stent was patent at the end of the procedure. Mild tortuosity of the right ureter. Complete obstruction of the distal right ureter at the right ureterovesical junction. Catheter or wire could not be advanced beyond the right ureterovesical junction. Therefore, a new right nephrostomy tube was placed. 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 Successful placement of an antegrade left ureter stent. Unable to pass a catheter or wire beyond the obstruction in the distal right ureter. Therefore, a new right percutaneous nephrostomy tube was placed. We can attempt another right ureter stent in 1-2 weeks. Left percutaneous nephrostomy tube is still present. The tube is currently capped because the left ureter stent is working properly. Plan to remove this tube when the patient's Foley catheter has been removed and he is voiding normally. Electronically Signed   By: Richarda Overlie M.D.   On: 10/17/2016 18:23    Scheduled: . iopamidol      . senna-docusate  1 tablet Oral BID     LOS: 4 days   Wilsie Kern D 10/18/2016,1:25 PM

## 2016-10-18 NOTE — Progress Notes (Signed)
Referring Physician(s):  Dr. 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  Bilateral PCNs in place.   Allergies: Patient has no known allergies.  Medications: Prior to Admission medications   Medication Sig Start Date End Date Taking? Authorizing Provider  HYDROcodone-acetaminophen (NORCO) 10-325 MG tablet Take 1-2 tablets by mouth every 4 (four) hours as needed for moderate pain. Maximum dose per 24 hours - 8 pills Patient not taking: Reported on 10/14/2016 07/04/16   Ihor Gully, MD  phenazopyridine (PYRIDIUM) 200 MG tablet Take 1 tablet (200 mg total) by mouth 3 (three) times daily as needed for pain. Patient not taking: Reported on 10/14/2016 07/04/16   Ihor Gully, MD     Vital Signs: BP 137/88 (BP Location: Left Arm)   Pulse 91   Temp 98.3 F (36.8 C) (Oral)   Resp 18   Wt 122 lb 9.6 oz (55.6 kg)   SpO2 100%   BMI 17.59 kg/m   Physical Exam  Constitutional: He is oriented to person, place, and time. He appears well-developed.  Cardiovascular: Normal rate, regular rhythm and normal heart sounds.   Pulmonary/Chest: Effort normal and breath sounds normal.  Abdominal:  Bialteral PCN remains in place with blood-tinged UOP.  Left was capped yesterday.  Foley has been removed.  Neurological: He is alert and oriented to person, place, and time.  Skin: Skin is warm and dry.  Psychiatric: He has a normal mood and affect. His behavior is normal. Judgment and thought content normal.  Nursing note and vitals reviewed.    Imaging: [place] Intraoperative  Result Date: 10/14/2016 CLINICAL DATA:  Ultrasound was provided for use by the ordering physician, and a technical charge was applied by the performing facility.  No radiologist interpretation/professional services rendered.   Ct Renal Stone Study  Result Date: 10/14/2016 CLINICAL DATA:   Abdominal pain. History of bladder lesion and hydronephrosis. EXAM: CT ABDOMEN AND PELVIS WITHOUT CONTRAST TECHNIQUE: Multidetector CT imaging of the abdomen and pelvis was performed following the standard protocol without oral or intravenous contrast material administration. COMPARISON:  June 02, 2016 FINDINGS: Lower chest: Lung bases are clear. Hepatobiliary: No focal liver lesions are appreciable on this noncontrast enhanced study. Gallbladder wall is not appreciably thickened. There is no biliary duct dilatation. Pancreas: No pancreatic mass or inflammatory focus. Spleen: No splenic lesions are evident. Adrenals/Urinary Tract: Adrenals appear unremarkable. There is marked hydronephrosis bilaterally with diffuse ureterectasis to the level of the urinary bladder. There is no renal mass. No calculi noted in either kidney or ureter. The urinary bladder once again shows diffuse wall thickening in a circumferential manner. No well-defined bladder mass is evident. Stomach/Bowel: There is no appreciable bowel wall thickening. There is somewhat generalized mesenteric thickening due to anasarca. There is no bowel obstruction. No free air or portal venous air. Vascular/Lymphatic: No abdominal aortic aneurysm. No vascular lesions are evident. There is no adenopathy in the abdomen or pelvis. Reproductive: Prostate is enlarged for age. Seminal vesicles appear unremarkable. Other: Appendix appears unremarkable. No abscess or ascites evident. Musculoskeletal: There are no blastic or lytic bone lesions. There is generalized anasarca. There are no intramuscular lesions. IMPRESSION: Generalized anasarca, likely due to chronic renal obstruction bilaterally. There is again noted severe hydronephrosis and ureterectasis bilaterally with diffuse urinary bladder wall thickening, likely impressing on the respective ureterovesical junction stone causing hydronephrosis in ureterectasis due to the abnormality  in the urinary bladder.  There is no well-defined urinary bladder mass. The appearance raises question of a chronic inflammatory type lesion causing the generalized bladder wall thickening. It should be noted that neoplastic etiology cannot be excluded. Prostate is enlarged for age. The significance of this finding in light of the other findings is uncertain. There may be a degree of chronic bladder outlet obstruction due to the prostate enlargement. Urologic assessment is advised given these findings. Note that there are no renal or ureteral calculi. Note that the appearance of the prostate, bladder, and kidneys is similar to prior study. No bowel obstruction.  Appendix appears normal.  No adenopathy. Electronically Signed   By: Bretta Bang III M.D.   On: 10/14/2016 15:10   Ir Nephrostomy Placement Left  Result Date: 10/15/2016 INDICATION: 21 year old with acute on chronic renal failure. Bilateral hydronephrosis. EXAM: PLACEMENT OF BILATERAL PERCUTANEOUS NEPHROSTOMY TUBES WITH ULTRASOUND AND FLUOROSCOPIC GUIDANCE COMPARISON:  Abdominal CT 10/14/2016 MEDICATIONS: Ciprofloxacin 400 mg; The antibiotic was administered in an appropriate time frame prior to skin puncture. ANESTHESIA/SEDATION: Fentanyl 75 mcg IV; Versed 5.0 mg IV Moderate Sedation Time:  33 minutes The patient was continuously monitored during the procedure by the interventional radiology nurse under my direct supervision. CONTRAST:  25 mL Isovue-300 - administered into the collecting system(s) FLUOROSCOPY TIME:  Fluoroscopy Time: 2 minutes 48 seconds (15 mGy). COMPLICATIONS: None immediate. PROCEDURE: Informed written consent was obtained from the patient after a thorough discussion of the procedural risks, benefits and alternatives. All questions were addressed. Maximal Sterile Barrier Technique was utilized including caps, mask, sterile gowns, sterile gloves, sterile drape, hand hygiene and skin antiseptic. A timeout was performed prior to the initiation of the  procedure. Patient was placed prone. Patient's back was prepped and draped in sterile fashion. Ultrasound demonstrated severe bilateral hydronephrosis. The left flank was anesthetized with 1% lidocaine. 21 gauge needle directed into a dilated lower pole calyx with ultrasound guidance. Clear urine was draining from the needle hub. A 0.018 wire was advanced into the renal collecting system and a dilator set was placed. Bentson wire was placed. The tract was dilated to accommodate a 10.2 [place] multipurpose drain. Contrast injection confirmed placement in the renal pelvis. Catheter was sutured to skin and attached to gravity bag. The right flank was anesthetized with 1% lidocaine. 21 gauge needle directed into a mid/lower pole calyx with ultrasound guidance. 0.018 wire was advanced into the renal pelvis. Dilator set was placed. Bentson wire was advanced into the collecting system and tract was dilated to accommodate a 10.2 [place] multipurpose drain. Drain was placed in the renal pelvis. Catheter was sutured to skin and attached to gravity bag. Fluoroscopic and ultrasound images were taken and saved for documentation. FINDINGS: Severe bilateral hydronephrosis. Placement of 10.2 French drains in both kidneys. Drains positioned in the renal pelvis bilaterally. IMPRESSION: Successful placement of bilateral percutaneous nephrostomy tubes with ultrasound and fluoroscopic guidance. Electronically Signed   By: Richarda Overlie M.D.   On: 10/15/2016 13:45   Ir Nephrostomy Placement Right  Result Date: 10/15/2016 INDICATION: 21 year old with acute on chronic renal failure. Bilateral hydronephrosis. EXAM: PLACEMENT OF BILATERAL PERCUTANEOUS NEPHROSTOMY TUBES WITH ULTRASOUND AND FLUOROSCOPIC GUIDANCE COMPARISON:  Abdominal CT 10/14/2016 MEDICATIONS: Ciprofloxacin 400 mg; The antibiotic was administered in an appropriate time frame prior to skin puncture. ANESTHESIA/SEDATION: Fentanyl 75 mcg IV; Versed 5.0 mg IV Moderate Sedation  Time:  33 minutes The patient was continuously monitored during the procedure by the interventional radiology nurse under my direct  supervision. CONTRAST:  25 mL Isovue-300 - administered into the collecting system(s) FLUOROSCOPY TIME:  Fluoroscopy Time: 2 minutes 48 seconds (15 mGy). COMPLICATIONS: None immediate. PROCEDURE: Informed written consent was obtained from the patient after a thorough discussion of the procedural risks, benefits and alternatives. All questions were addressed. Maximal Sterile Barrier Technique was utilized including caps, mask, sterile gowns, sterile gloves, sterile drape, hand hygiene and skin antiseptic. A timeout was performed prior to the initiation of the procedure. Patient was placed prone. Patient's back was prepped and draped in sterile fashion. Ultrasound demonstrated severe bilateral hydronephrosis. The left flank was anesthetized with 1% lidocaine. 21 gauge needle directed into a dilated lower pole calyx with ultrasound guidance. Clear urine was draining from the needle hub. A 0.018 wire was advanced into the renal collecting system and a dilator set was placed. Bentson wire was placed. The tract was dilated to accommodate a 10.2 [place] multipurpose drain. Contrast injection confirmed placement in the renal pelvis. Catheter was sutured to skin and attached to gravity bag. The right flank was anesthetized with 1% lidocaine. 21 gauge needle directed into a mid/lower pole calyx with ultrasound guidance. 0.018 wire was advanced into the renal pelvis. Dilator set was placed. Bentson wire was advanced into the collecting system and tract was dilated to accommodate a 10.2 [place] multipurpose drain. Drain was placed in the renal pelvis. Catheter was sutured to skin and attached to gravity bag. Fluoroscopic and ultrasound images were taken and saved for documentation. FINDINGS: Severe bilateral hydronephrosis. Placement of 10.2 French drains in both kidneys. Drains positioned in the  renal pelvis bilaterally. IMPRESSION: Successful placement of bilateral percutaneous nephrostomy tubes with ultrasound and fluoroscopic guidance. Electronically Signed   By: Richarda Overlie M.D.   On: 10/15/2016 13:45   Ir Nephrostomy Exchange Left  Result Date: 10/17/2016 INDICATION: 21 year old with obstructive uropathy. Patient's kidney function has markedly improved after placement of bilateral nephrostomy tubes. Patient has bladder and prostate thickening. Request for placement of antegrade ureter stents. EXAM: EXCHANGE OF BILATERAL NEPHROSTOMY TUBES WITH FLUOROSCOPY PLACEMENT OF ANTEGRADE LEFT URETER STENT WITH FLUOROSCOPY ATTEMPTED PLACEMENT OF AN ANTEGRADE RIGHT URETER STENT WITH FLUOROSCOPY COMPARISON:  10/15/2016 MEDICATIONS: Ciprofloxacin 400 mg; The antibiotic was administered in an appropriate time frame prior to skin puncture. ANESTHESIA/SEDATION: Fentanyl 100 mcg IV; Versed 6.0 mg IV Moderate Sedation Time:  60 minutes The patient was continuously monitored during the procedure by the interventional radiology nurse under my direct supervision. CONTRAST:  40 mL Isovue-300 - administered into the collecting system(s) FLUOROSCOPY TIME:  Fluoroscopy Time: 19 minutes and 48 seconds, 96 mGy COMPLICATIONS: None immediate. PROCEDURE: Informed written consent was obtained from the patient after a thorough discussion of the procedural risks, benefits and alternatives. All questions were addressed. Maximal Sterile Barrier Technique was utilized including caps, mask, sterile gowns, sterile gloves, sterile drape, hand hygiene and skin antiseptic. A timeout was performed prior to the initiation of the procedure. Both flanks were prepped and draped in a sterile fashion. Contrast was injected through the left nephrostomy tube. Catheter was cut and removed over a Bentson wire. A 5 [place] Kumpe catheter was advanced down the left ureter with some difficulty. The left ureter is very tortuous. A Roadrunner wire was used to  advance the catheter. The 5 French catheter was exchanged for a 4 [place] glide catheter. Four French glide catheter and Roadrunner wire were successfully advanced across the left ureterovesical junction. Contrast injection confirmed placement in the bladder. Stiff Amplatz wire was placed. A 7 [place]  vascular sheath was placed and a Bentson wire was placed as a safety wire. An 8 French 24 cm ureter catheter was advanced over the Amplatz wire and successfully deployed in the left ureter. Proximal aspect was placed in the renal pelvis and the distal aspect was placed in the urinary bladder. A 10 French Dawson Mueller catheter was advanced over the Bentson wire reconstituted in the left renal pelvis. Follow-up left nephrostogram was performed. Skin was anesthetized with 1% lidocaine and the catheter was sutured to skin. Attention was directed to the right kidney. Catheter was injected with contrast. Catheter was cut and removed over a Bentson wire. 5 [place] Kumpe the catheter was successfully advanced to the distal right ureter with a Roadrunner wire. This wire would not advance into the urinary bladder. Catheter was exchanged for the Glide catheter. Wire and catheter could not be successfully advanced into the bladder. Therefore, the catheter was removed over a Bentson wire and a new 10.2 [place] multipurpose drain was reconstituted in the right renal pelvis. Skin was anesthetized with 1% lidocaine and the catheter was sutured to the skin. Right nephrostomy tube was attached to gravity bag. Left nephrostomy tube was capped. FINDINGS: Left ureter was mildly dilated and very tortuous. Obstruction at the left ureterovesical junction. Catheter and wire were successfully advanced beyond the left ureterovesical junction. A double-J left ureter stent was successfully placed. Left ureter stent was patent at the end of the procedure. Mild tortuosity of the right ureter. Complete obstruction of the distal right ureter at the  right ureterovesical junction. Catheter or wire could not be advanced beyond the right ureterovesical junction. Therefore, a new right nephrostomy tube was placed. 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 Successful placement of an antegrade left ureter stent. Unable to pass a catheter or wire beyond the obstruction in the distal right ureter. Therefore, a new right percutaneous nephrostomy tube was placed. We can attempt another right ureter stent in 1-2 weeks. Left percutaneous nephrostomy tube is still present. The tube is currently capped because the left ureter stent is working properly. Plan to remove this tube when the patient's Foley catheter has been removed and he is voiding normally. Electronically Signed   By: Richarda Overlie M.D.   On: 10/17/2016 18:23   Ir Nephrostomy Exchange Right  Result Date: 10/17/2016 INDICATION: 21 year old with obstructive uropathy. Patient's kidney function has markedly improved after placement of bilateral nephrostomy tubes. Patient has bladder and prostate thickening. Request for placement of antegrade ureter stents. EXAM: EXCHANGE OF BILATERAL NEPHROSTOMY TUBES WITH FLUOROSCOPY PLACEMENT OF ANTEGRADE LEFT URETER STENT WITH FLUOROSCOPY ATTEMPTED PLACEMENT OF AN ANTEGRADE RIGHT URETER STENT WITH FLUOROSCOPY COMPARISON:  10/15/2016 MEDICATIONS: Ciprofloxacin 400 mg; The antibiotic was administered in an appropriate time frame prior to skin puncture. ANESTHESIA/SEDATION: Fentanyl 100 mcg IV; Versed 6.0 mg IV Moderate Sedation Time:  60 minutes The patient was continuously monitored during the procedure by the interventional radiology nurse under my direct supervision. CONTRAST:  40 mL Isovue-300 - administered into the collecting system(s) FLUOROSCOPY TIME:  Fluoroscopy Time: 19 minutes and 48 seconds, 96 mGy COMPLICATIONS: None immediate. PROCEDURE: Informed written consent was obtained from the patient after a thorough discussion  of the procedural risks, benefits and alternatives. All questions were addressed. Maximal Sterile Barrier Technique was utilized including caps, mask, sterile gowns, sterile gloves, sterile drape, hand hygiene and skin antiseptic. A timeout was performed prior to the initiation of the procedure. Both flanks were prepped and draped in a  sterile fashion. Contrast was injected through the left nephrostomy tube. Catheter was cut and removed over a Bentson wire. A 5 [place] Kumpe catheter was advanced down the left ureter with some difficulty. The left ureter is very tortuous. A Roadrunner wire was used to advance the catheter. The 5 French catheter was exchanged for a 4 [place] glide catheter. Four French glide catheter and Roadrunner wire were successfully advanced across the left ureterovesical junction. Contrast injection confirmed placement in the bladder. Stiff Amplatz wire was placed. A 7 French vascular sheath was placed and a Bentson wire was placed as a safety wire. An 8 French 24 cm ureter catheter was advanced over the Amplatz wire and successfully deployed in the left ureter. Proximal aspect was placed in the renal pelvis and the distal aspect was placed in the urinary bladder. A 10 French Dawson Mueller catheter was advanced over the Bentson wire reconstituted in the left renal pelvis. Follow-up left nephrostogram was performed. Skin was anesthetized with 1% lidocaine and the catheter was sutured to skin. Attention was directed to the right kidney. Catheter was injected with contrast. Catheter was cut and removed over a Bentson wire. 5 [place] Kumpe the catheter was successfully advanced to the distal right ureter with a Roadrunner wire. This wire would not advance into the urinary bladder. Catheter was exchanged for the Glide catheter. Wire and catheter could not be successfully advanced into the bladder. Therefore, the catheter was removed over a Bentson wire and a new 10.2 [place] multipurpose drain was  reconstituted in the right renal pelvis. Skin was anesthetized with 1% lidocaine and the catheter was sutured to the skin. Right nephrostomy tube was attached to gravity bag. Left nephrostomy tube was capped. FINDINGS: Left ureter was mildly dilated and very tortuous. Obstruction at the left ureterovesical junction. Catheter and wire were successfully advanced beyond the left ureterovesical junction. A double-J left ureter stent was successfully placed. Left ureter stent was patent at the end of the procedure. Mild tortuosity of the right ureter. Complete obstruction of the distal right ureter at the right ureterovesical junction. Catheter or wire could not be advanced beyond the right ureterovesical junction. Therefore, a new right nephrostomy tube was placed. 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 Successful placement of an antegrade left ureter stent. Unable to pass a catheter or wire beyond the obstruction in the distal right ureter. Therefore, a new right percutaneous nephrostomy tube was placed. We can attempt another right ureter stent in 1-2 weeks. Left percutaneous nephrostomy tube is still present. The tube is currently capped because the left ureter stent is working properly. Plan to remove this tube when the patient's Foley catheter has been removed and he is voiding normally. Electronically Signed   By: Richarda Overlie M.D.   On: 10/17/2016 18:23   Ir Ureteral Stent Placement Existing Access Left  Result Date: 10/17/2016 INDICATION: 21 year old with obstructive uropathy. Patient's kidney function has markedly improved after placement of bilateral nephrostomy tubes. Patient has bladder and prostate thickening. Request for placement of antegrade ureter stents. EXAM: EXCHANGE OF BILATERAL NEPHROSTOMY TUBES WITH FLUOROSCOPY PLACEMENT OF ANTEGRADE LEFT URETER STENT WITH FLUOROSCOPY ATTEMPTED PLACEMENT OF AN ANTEGRADE RIGHT URETER STENT WITH FLUOROSCOPY  COMPARISON:  10/15/2016 MEDICATIONS: Ciprofloxacin 400 mg; The antibiotic was administered in an appropriate time frame prior to skin puncture. ANESTHESIA/SEDATION: Fentanyl 100 mcg IV; Versed 6.0 mg IV Moderate Sedation Time:  60 minutes The patient was continuously monitored during the procedure by the interventional radiology nurse  under my direct supervision. CONTRAST:  40 mL Isovue-300 - administered into the collecting system(s) FLUOROSCOPY TIME:  Fluoroscopy Time: 19 minutes and 48 seconds, 96 mGy COMPLICATIONS: None immediate. PROCEDURE: Informed written consent was obtained from the patient after a thorough discussion of the procedural risks, benefits and alternatives. All questions were addressed. Maximal Sterile Barrier Technique was utilized including caps, mask, sterile gowns, sterile gloves, sterile drape, hand hygiene and skin antiseptic. A timeout was performed prior to the initiation of the procedure. Both flanks were prepped and draped in a sterile fashion. Contrast was injected through the left nephrostomy tube. Catheter was cut and removed over a Bentson wire. A 5 [place] Kumpe catheter was advanced down the left ureter with some difficulty. The left ureter is very tortuous. A Roadrunner wire was used to advance the catheter. The 5 French catheter was exchanged for a 4 [place] glide catheter. Four French glide catheter and Roadrunner wire were successfully advanced across the left ureterovesical junction. Contrast injection confirmed placement in the bladder. Stiff Amplatz wire was placed. A 7 French vascular sheath was placed and a Bentson wire was placed as a safety wire. An 8 French 24 cm ureter catheter was advanced over the Amplatz wire and successfully deployed in the left ureter. Proximal aspect was placed in the renal pelvis and the distal aspect was placed in the urinary bladder. A 10 French Dawson Mueller catheter was advanced over the Bentson wire reconstituted in the left renal pelvis.  Follow-up left nephrostogram was performed. Skin was anesthetized with 1% lidocaine and the catheter was sutured to skin. Attention was directed to the right kidney. Catheter was injected with contrast. Catheter was cut and removed over a Bentson wire. 5 [place] Kumpe the catheter was successfully advanced to the distal right ureter with a Roadrunner wire. This wire would not advance into the urinary bladder. Catheter was exchanged for the Glide catheter. Wire and catheter could not be successfully advanced into the bladder. Therefore, the catheter was removed over a Bentson wire and a new 10.2 [place] multipurpose drain was reconstituted in the right renal pelvis. Skin was anesthetized with 1% lidocaine and the catheter was sutured to the skin. Right nephrostomy tube was attached to gravity bag. Left nephrostomy tube was capped. FINDINGS: Left ureter was mildly dilated and very tortuous. Obstruction at the left ureterovesical junction. Catheter and wire were successfully advanced beyond the left ureterovesical junction. A double-J left ureter stent was successfully placed. Left ureter stent was patent at the end of the procedure. Mild tortuosity of the right ureter. Complete obstruction of the distal right ureter at the right ureterovesical junction. Catheter or wire could not be advanced beyond the right ureterovesical junction. Therefore, a new right nephrostomy tube was placed. 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 Successful placement of an antegrade left ureter stent. Unable to pass a catheter or wire beyond the obstruction in the distal right ureter. Therefore, a new right percutaneous nephrostomy tube was placed. We can attempt another right ureter stent in 1-2 weeks. Left percutaneous nephrostomy tube is still present. The tube is currently capped because the left ureter stent is working properly. Plan to remove this tube when the patient's Foley catheter has  been removed and he is voiding normally. Electronically Signed   By: Richarda Overlie M.D.   On: 10/17/2016 18:23    Labs:  CBC:  Recent Labs  07/04/16 0945 07/04/16 1047 10/14/16 1339 10/16/16 0533  WBC  --   --  5.6 7.9  HGB 15.0 15.6 9.8* 8.6*  HCT  --  46.0 27.6* 24.8*  PLT  --   --  232 234    COAGS:  Recent Labs  10/15/16 0923  INR 1.24  APTT 29    BMP:  Recent Labs  10/15/16 1327 10/16/16 0533 10/17/16 0508 10/18/16 0513  NA 140 141 136 137  K 6.3* 4.5 4.4 4.0  CL 111 108 102 103  CO2 18* 23 26 27   GLUCOSE 112* 103* 94 94  BUN 116* 56* 21* 17  CALCIUM 9.5 8.7* 8.5* 7.9*  CREATININE 22.39* 7.09* 2.09* 1.49*  GFRNONAA 2* 10* 44* >60  GFRAA 3* 12* 51* >60    LIVER FUNCTION TESTS:  Recent Labs  10/15/16 0649 10/16/16 0533  BILITOT  --  0.7  AST  --  17  ALT  --  18  ALKPHOS  --  54  PROT  --  7.2  ALBUMIN 3.3* 3.8    Assessment and Plan: 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 Lowella Dandy. Patient doing well s/p left ureteral stent placement.  Left PCN was capped and left in place pending +UOP without Foley prior to removal.  Foley has been removed and patient has been able to urinate today.   Right PCN will stay until re-attempt at ureteral stent placement in a few weeks.  Will plan to remove Left PCN today.   Plans for d/c today per Urology.  Electronically Signed: Hoyt Koch 10/18/2016, 10:07 AM   I spent a total of 15 Minutes at the the patient's bedside AND on the patient's hospital floor or unit, greater than 50% of which was counseling/coordinating care for obstructive uropathy.

## 2016-10-19 ENCOUNTER — Encounter (HOSPITAL_COMMUNITY): Payer: Self-pay | Admitting: Urology

## 2016-10-19 ENCOUNTER — Other Ambulatory Visit (HOSPITAL_COMMUNITY): Payer: Self-pay | Admitting: Urology

## 2016-10-19 DIAGNOSIS — N1339 Other hydronephrosis: Secondary | ICD-10-CM

## 2016-10-19 DIAGNOSIS — N400 Enlarged prostate without lower urinary tract symptoms: Secondary | ICD-10-CM

## 2016-10-31 ENCOUNTER — Other Ambulatory Visit: Payer: Self-pay | Admitting: Student

## 2016-11-01 ENCOUNTER — Other Ambulatory Visit (HOSPITAL_COMMUNITY): Payer: Self-pay | Admitting: Urology

## 2016-11-01 ENCOUNTER — Other Ambulatory Visit (HOSPITAL_COMMUNITY): Payer: Self-pay | Admitting: Interventional Radiology

## 2016-11-01 ENCOUNTER — Ambulatory Visit (HOSPITAL_COMMUNITY)
Admission: RE | Admit: 2016-11-01 | Discharge: 2016-11-01 | Disposition: A | Discharge status: Home / Self Care | Payer: BLUE CROSS/BLUE SHIELD | Source: Ambulatory Visit | Attending: Urology | Admitting: Urology

## 2016-11-01 ENCOUNTER — Encounter (HOSPITAL_COMMUNITY): Payer: Self-pay

## 2016-11-01 DIAGNOSIS — N400 Enlarged prostate without lower urinary tract symptoms: Secondary | ICD-10-CM

## 2016-11-01 DIAGNOSIS — N135 Crossing vessel and stricture of ureter without hydronephrosis: Secondary | ICD-10-CM | POA: Insufficient documentation

## 2016-11-01 DIAGNOSIS — N1339 Other hydronephrosis: Secondary | ICD-10-CM

## 2016-11-01 DIAGNOSIS — N3080 Other cystitis without hematuria: Secondary | ICD-10-CM | POA: Diagnosis not present

## 2016-11-01 DIAGNOSIS — Z436 Encounter for attention to other artificial openings of urinary tract: Secondary | ICD-10-CM | POA: Insufficient documentation

## 2016-11-01 DIAGNOSIS — N1330 Unspecified hydronephrosis: Secondary | ICD-10-CM | POA: Insufficient documentation

## 2016-11-01 DIAGNOSIS — N320 Bladder-neck obstruction: Secondary | ICD-10-CM | POA: Insufficient documentation

## 2016-11-01 DIAGNOSIS — N189 Chronic kidney disease, unspecified: Secondary | ICD-10-CM | POA: Insufficient documentation

## 2016-11-01 HISTORY — PX: IR URETERAL STENT PLACEMENT EXISTING ACCESS RIGHT: IMG6074

## 2016-11-01 HISTORY — PX: IR NEPHROSTOMY EXCHANGE LEFT: IMG6069

## 2016-11-01 HISTORY — PX: IR BALLOON DILATION URETERAL STRICTURE RIGHT: IMG6082

## 2016-11-01 LAB — CBC
HCT: 24.5 % — ABNORMAL LOW (ref 39.0–52.0)
Hemoglobin: 8.3 g/dL — ABNORMAL LOW (ref 13.0–17.0)
MCH: 26.5 pg (ref 26.0–34.0)
MCHC: 33.9 g/dL (ref 30.0–36.0)
MCV: 78.3 fL (ref 78.0–100.0)
PLATELETS: 553 10*3/uL — AB (ref 150–400)
RBC: 3.13 MIL/uL — AB (ref 4.22–5.81)
RDW: 14.2 % (ref 11.5–15.5)
WBC: 11.2 10*3/uL — AB (ref 4.0–10.5)

## 2016-11-01 LAB — BASIC METABOLIC PANEL
Anion gap: 9 (ref 5–15)
BUN: 45 mg/dL — ABNORMAL HIGH (ref 6–20)
CALCIUM: 9.3 mg/dL (ref 8.9–10.3)
CO2: 28 mmol/L (ref 22–32)
CREATININE: 2.45 mg/dL — AB (ref 0.61–1.24)
Chloride: 97 mmol/L — ABNORMAL LOW (ref 101–111)
GFR calc non Af Amer: 36 mL/min — ABNORMAL LOW (ref >60)
GFR, EST AFRICAN AMERICAN: 42 mL/min — AB (ref >60)
Glucose, Bld: 96 mg/dL (ref 65–99)
Potassium: 4.6 mmol/L (ref 3.5–5.1)
SODIUM: 134 mmol/L — AB (ref 135–145)

## 2016-11-01 LAB — PROTIME-INR
INR: 1.23
PROTHROMBIN TIME: 15.6 s — AB (ref 11.4–15.2)

## 2016-11-01 LAB — APTT: aPTT: 38 seconds — ABNORMAL HIGH (ref 24–36)

## 2016-11-01 MED ORDER — FENTANYL CITRATE (PF) 100 MCG/2ML IJ SOLN
INTRAMUSCULAR | Status: AC | PRN
  Administered 2016-11-01 (×2): 25 ug via INTRAVENOUS
  Administered 2016-11-01: 50 ug via INTRAVENOUS
  Administered 2016-11-01 (×4): 25 ug via INTRAVENOUS
  Administered 2016-11-01: 50 ug via INTRAVENOUS

## 2016-11-01 MED ORDER — IOPAMIDOL (ISOVUE-300) INJECTION 61%
25.0000 mL | Freq: Once | INTRAVENOUS | Status: AC | PRN
  Administered 2016-11-01: 65 mL

## 2016-11-01 MED ORDER — MIDAZOLAM HCL 2 MG/2ML IJ SOLN
INTRAMUSCULAR | Status: AC | PRN
  Administered 2016-11-01 (×2): 0.5 mg via INTRAVENOUS
  Administered 2016-11-01: 1 mg via INTRAVENOUS
  Administered 2016-11-01 (×4): 0.5 mg via INTRAVENOUS
  Administered 2016-11-01: 1 mg via INTRAVENOUS

## 2016-11-01 MED ORDER — LIDOCAINE HCL 1 % IJ SOLN
INTRAMUSCULAR | Status: AC
  Filled 2016-11-01: qty 20

## 2016-11-01 MED ORDER — CIPROFLOXACIN IN D5W 400 MG/200ML IV SOLN: 400.0000 mg | INTRAVENOUS | Status: DC

## 2016-11-01 MED ORDER — MIDAZOLAM HCL 2 MG/2ML IJ SOLN
INTRAMUSCULAR | Status: AC
  Filled 2016-11-01: qty 2

## 2016-11-01 MED ORDER — SODIUM CHLORIDE 0.9 % IV SOLN
INTRAVENOUS | Status: DC
  Administered 2016-11-01: 10:00:00 via INTRAVENOUS

## 2016-11-01 MED ORDER — FLUMAZENIL 0.5 MG/5ML IV SOLN
INTRAVENOUS | Status: AC
  Filled 2016-11-01: qty 5

## 2016-11-01 MED ORDER — IOPAMIDOL (ISOVUE-300) INJECTION 61%
INTRAVENOUS | Status: DC
Start: 2016-11-01 — End: 2016-11-02
  Filled 2016-11-01: qty 50

## 2016-11-01 MED ORDER — NALOXONE HCL 0.4 MG/ML IJ SOLN
INTRAMUSCULAR | Status: AC
  Filled 2016-11-01: qty 1

## 2016-11-01 MED ORDER — MIDAZOLAM HCL 2 MG/2ML IJ SOLN
INTRAMUSCULAR | Status: AC
  Filled 2016-11-01: qty 4

## 2016-11-01 MED ORDER — IOPAMIDOL (ISOVUE-300) INJECTION 61%
INTRAVENOUS | Status: AC
  Administered 2016-11-01: 65 mL
  Filled 2016-11-01: qty 50

## 2016-11-01 MED ORDER — LIDOCAINE HCL 1 % IJ SOLN
INTRAMUSCULAR | Status: AC | PRN
  Administered 2016-11-01 (×2): 5 mL

## 2016-11-01 MED ORDER — FENTANYL CITRATE (PF) 100 MCG/2ML IJ SOLN
INTRAMUSCULAR | Status: AC
  Filled 2016-11-01: qty 6

## 2016-11-01 NOTE — H&P (Signed)
Referring Physician(s): Ihor Gully  Supervising Physician: Gilmer Mor  Patient Status:  [facility]  Chief Complaint:  Bilateral hydronephrosis  Subjective: Patient familiar to IR service from previously placed bilateral percutaneous nephrostomies on 10/15/16 as well as left ureteral stent placement on 10/17/16. Right ureteral stent placement was unsuccessful at that time. He has a history of acute on chronic renal failure with severe bilateral hydronephrosis secondary to an enlarged prostate and inflammatory bladder outlet obstruction. He presents again today for reattempt at right ureteral stent placement as well as left nephrostogram with possible nephrostomy removal. He currently denies fever, headache, chest pain, dyspnea, cough, abdominal pain,nausea, vomiting or abnormal bleeding. He does have some soreness at PCN insertion sites.  Past Medical History:  Diagnosis Date  . Bladder mass 05/2016  . Hydronephrosis, bilateral 05/2016   Past Surgical History:  Procedure Laterality Date  . COLONOSCOPY    . CYSTOSCOPY W/ RETROGRADES Bilateral 07/04/2016   Procedure: CYSTOSCOPY WITH RETROGRADE PYELOGRAM, BLADDER BIOPSY, PROSTATE INCISION;  Surgeon: Ihor Gully, MD;  Location: [facility];  Service: Urology;  Laterality: Bilateral;  . CYSTOSCOPY W/ URETERAL STENT PLACEMENT Bilateral 10/14/2016   Procedure: CYSTOSCOPY AND BLADDER BIOPSY WITH FULGERATION;  Surgeon: Sebastian Ache, MD;  Location: [facility];  Service: Urology;  Laterality: Bilateral;  . IR GENERIC HISTORICAL  10/15/2016   IR NEPHROSTOMY PLACEMENT RIGHT 10/15/2016 Richarda Overlie, MD [facility]  . IR GENERIC HISTORICAL  10/15/2016   IR NEPHROSTOMY PLACEMENT LEFT 10/15/2016 Richarda Overlie, MD [facility]  . IR NEPHROSTOGRAM LEFT THRU EXISTING ACCESS  10/18/2016  . IR NEPHROSTOMY EXCHANGE LEFT  10/17/2016  . IR NEPHROSTOMY EXCHANGE RIGHT  10/17/2016  . IR URETERAL STENT PLACEMENT EXISTING ACCESS LEFT  10/17/2016  . PROSTATE  BIOPSY N/A 10/14/2016   Procedure: TRANSRECTAL ULTRASOUND FOR PROSTATE BIOPSY;  Surgeon: Sebastian Ache, MD;  Location: [facility];  Service: Urology;  Laterality: N/A;  . TRANSURETHRAL RESECTION OF BLADDER TUMOR N/A 07/04/2016   Procedure: TRANSURETHRAL RESECTION OF BLADDER TUMOR (TURBT);  Surgeon: Ihor Gully, MD;  Location: [facility];  Service: Urology;  Laterality: N/A;    Allergies: Patient has no known allergies.  Medications: Prior to Admission medications   Medication Sig Start Date End Date Taking? Authorizing Provider  HYDROcodone-acetaminophen (NORCO) 10-325 MG tablet Take 1-2 tablets by mouth every 4 (four) hours as needed for moderate pain. Maximum dose per 24 hours - 8 pills Patient not taking: Reported on 10/14/2016 07/04/16   Ihor Gully, MD  HYDROcodone-acetaminophen [facility]) 10-325 MG tablet Take 1-2 tablets by mouth every 4 (four) hours as needed for moderate pain. Maximum dose per 24 hours - 8 pills 10/18/16   Ihor Gully, MD  phenazopyridine (PYRIDIUM) 200 MG tablet Take 1 tablet (200 mg total) by mouth 3 (three) times daily as needed for pain. Patient not taking: Reported on 10/14/2016 07/04/16   Ihor Gully, MD     Vital Signs: Vitals:   11/01/16 0945  BP: 122/68  Pulse: 95  Resp: 18  Temp: 98.4 F (36.9 C)     Physical Exam Awake, alert. Chest clear to auscultation bilaterally. Heart with regular rate and rhythm. Abdomen soft, positive bowel sounds, nontender. Left and right PCN catheters intact, insertion sites okay, mildly tender, left PCN capped, light yellow urine in right nephrostomy bag; no lower extremity edema  Imaging: No results found.  Labs:  CBC:  Recent Labs  07/04/16 0945 07/04/16 1047 10/14/16 1339 10/16/16 0533  WBC  --   --  5.6 7.9  HGB 15.0 15.6 9.8* 8.6*  HCT  --  46.0 27.6* 24.8*  PLT  --   --  232 234    COAGS:  Recent Labs  10/15/16 0923  INR 1.24  APTT 29    BMP:  Recent Labs  10/15/16 1327  10/16/16 0533 10/17/16 0508 10/18/16 0513  NA 140 141 136 137  K 6.3* 4.5 4.4 4.0  CL 111 108 102 103  CO2 18* GLUCOSE 112* 103* 94 94  BUN 116* 56* 21* 17  CALCIUM 9.5 8.7* 8.5* 7.9*  CREATININE 22.39* 7.09* 2.09* 1.49*  GFRNONAA 2* 10* 44* >60  GFRAA 3* 12* 51* >60    LIVER FUNCTION TESTS:  Recent Labs  10/15/16 0649 10/16/16 0533  BILITOT  --  0.7  AST  --  17  ALT  --  18  ALKPHOS  --  54  PROT  --  7.2  ALBUMIN 3.3* 3.8    Assessment and Plan: Pt with history of acute on chronic renal failure with severe bilateral hydronephrosis secondary to an enlarged prostate and inflammatory bladder outlet obstruction, s/p bilat PCN's on 10/15/16, left JJ stent 10/17/16, failed attempt at right JJ stent placement.  He presents again today for reattempt at right ureteral stent placement as well as left nephrostogram with possible nephrostomy removal.Details/risks of procedures, including but not limited to, internal bleeding, infection, inability to place stent and need for prolonged external drainage discussed with patient and brother with their understanding and consent. Labs pend.   Electronically Signed: D. Jeananne Rama 11/01/2016, 9:31 AM   I spent a total of 20 minutes at the the patient's bedside AND on the patient's hospital floor or unit, greater than 50% of which was counseling/coordinating care for left nephrostogram, possible right ureteral stent placement

## 2016-11-01 NOTE — Procedures (Signed)
Interventional Radiology Procedure Note  Procedure:  Right antegrade ureteral stent (26cm, 71F) through existing PCN, with exchange for a new right 74F PCN. Left antegrade nephrostogram, with exchange of the left PCN for a new 74F PCN.   Both PCN to gravity, as we cannot confirm stent patency on the left at this time, and the right is fresh, with potential for occlusion given the mild intraoperative bleeding.  .  Complications: None  Recommendations:  - 1 hour recovery. - Advance diet - follow up with Urology for stent exchange.  - Would recommend not capping or removing the right or the left PCN until either stent exchange or confirmation of patency with injection bilaterally.  Unless stent exchange occurs in the interval, will schedule for return to [facility] in ~1-2 weeks for bilateral PCN injection and possible capping trial initiation.  - Routine catheter care   Signed,  Yvone Neu. Loreta Ave, DO

## 2016-11-01 NOTE — Discharge Instructions (Signed)
Moderate Conscious Sedation, Adult, Care After °These instructions provide you with information about caring for yourself after your procedure. Your health care provider may also give you more specific instructions. Your treatment has been planned according to current medical practices, but problems sometimes occur. Call your health care provider if you have any problems or questions after your procedure. °What can I expect after the procedure? °After your procedure, it is common: °· To feel sleepy for several hours. °· To feel clumsy and have poor balance for several hours. °· To have poor judgment for several hours. °· To vomit if you eat too soon. °Follow these instructions at home: °For at least 24 hours after the procedure:  ° °· Do not: °¨ Participate in activities where you could fall or become injured. °¨ Drive. °¨ Use heavy machinery. °¨ Drink alcohol. °¨ Take sleeping pills or medicines that cause drowsiness. °¨ Make important decisions or sign legal documents. °¨ Take care of children on your own. °· Rest. °Eating and drinking  °· Follow the diet recommended by your health care provider. °· If you vomit: °¨ Drink water, juice, or soup when you can drink without vomiting. °¨ Make sure you have little or no nausea before eating solid foods. °General instructions  °· Have a responsible adult stay with you until you are awake and alert. °· Take over-the-counter and prescription medicines only as told by your health care provider. °· If you smoke, do not smoke without supervision. °· Keep all follow-up visits as told by your health care provider. This is important. °Contact a health care provider if: °· You keep feeling nauseous or you keep vomiting. °· You feel light-headed. °· You develop a rash. °· You have a fever. °Get help right away if: °· You have trouble breathing. °This information is not intended to replace advice given to you by your health care provider. Make sure you discuss any questions you  have with your health care provider. °Document Released: 04/24/2013 Document Revised: 12/07/2015 Document Reviewed: 10/24/2015 °Elsevier Interactive Patient Education © 2017 [employer] °Percutaneous Nephrostomy, Care After °This sheet gives you information about how to care for yourself after your procedure. Your health care provider may also give you more specific instructions. If you have problems or questions, contact your health care provider. °What can I expect after the procedure? °After the procedure, it is common to have: °· Some soreness where the nephrostomy tube was inserted (tube insertion site). °· Blood-tinged drainage from the nephrostomy tube for the first 24 hours. °Follow these instructions at home: °Activity  °· Return to your normal activities as told by your health care provider. Ask your health care provider what activities are safe for you. °· Avoid activities that may cause the nephrostomy tubing to bend. °· Do not take baths, swim, or use a hot tub until your health care provider approves. Ask your health care provider if you can take showers. Cover the nephrostomy tube dressing with a watertight covering when you take a shower. °· Do not drive for 24 hours if you were given a medicine to help you relax (sedative). °Care of the tube insertion site  °· Follow instructions from your health care provider about how to take care of your tube insertion site. Make sure you: °¨ Wash your hands with soap and water before you change your bandage (dressing). If soap and water are not available, use hand sanitizer. °¨ Change your dressing as told by your health care provider. Be careful not   to pull on the tube while removing the dressing. °¨ When you change the dressing, wash the skin around the tube, rinse well, and pat the skin dry. °· Check the tube insertion area every day for signs of infection. Check for: °¨ More redness, swelling, or pain. °¨ More fluid or blood. °¨ Warmth. °¨ Pus or a bad  smell. °Care of the nephrostomy tube and drainage bag  °· Always keep the tubing, the leg bag, or the bedside drainage bags below the level of the kidney so that your urine drains freely. °· When connecting your nephrostomy tube to a drainage bag, make sure that there are no kinks in the tubing and that your urine is draining freely. You may want to use an elastic bandage to wrap any exposed tubing that goes from the nephrostomy tube to any of the connecting tubes. °· At night, you may want to connect your nephrostomy tube or the leg bag to a larger bedside drainage bag. °· Follow instructions from your health care provider about how to empty or change the drainage bag. °· Empty the drainage bag when it becomes ? full. °· Replace the drainage bag and any extension tubing that is connected to your nephrostomy tube every 3 weeks or as often as told by your health care provider. Your health care provider will explain how to change the drainage bag and extension tubing. °General instructions  °· Take over-the-counter and prescription medicines only as told by your health care provider. °· Keep all follow-up visits as told by your health care provider. This is important. °Contact a health care provider if: °· You have problems with any of the valves or tubing. °· You have persistent pain or soreness in your back. °· You have more redness, swelling, or pain around your tube insertion site. °· You have more fluid or blood coming from your tube insertion site. °· Your tube insertion site feels warm to the touch. °· You have pus or a bad smell coming from your tube insertion site. °· You have increased urine output or you feel burning when urinating. °Get help right away if: °· You have pain in your abdomen during the first week. °· You have chest pain or have trouble breathing. °· You have a new appearance of blood in your urine. °· You have a fever or chills. °· You have back pain that is not relieved by your  medicine. °· You have decreased urine output. °· Your nephrostomy tube comes out. °This information is not intended to replace advice given to you by your health care provider. Make sure you discuss any questions you have with your health care provider. °Document Released: 02/25/2004 Document Revised: 04/15/2016 Document Reviewed: 04/15/2016 °Elsevier Interactive Patient Education © 2017 [employer] ° °

## 2016-11-10 ENCOUNTER — Other Ambulatory Visit (HOSPITAL_COMMUNITY): Payer: Self-pay | Admitting: Interventional Radiology

## 2016-11-10 ENCOUNTER — Ambulatory Visit (HOSPITAL_COMMUNITY)
Admission: RE | Admit: 2016-11-10 | Discharge: 2016-11-10 | Disposition: A | Discharge status: Home / Self Care | Payer: BLUE CROSS/BLUE SHIELD | Source: Ambulatory Visit | Attending: Interventional Radiology | Admitting: Interventional Radiology

## 2016-11-10 ENCOUNTER — Encounter (HOSPITAL_COMMUNITY): Payer: Self-pay | Admitting: Interventional Radiology

## 2016-11-10 DIAGNOSIS — N400 Enlarged prostate without lower urinary tract symptoms: Secondary | ICD-10-CM

## 2016-11-10 DIAGNOSIS — Z466 Encounter for fitting and adjustment of urinary device: Secondary | ICD-10-CM | POA: Insufficient documentation

## 2016-11-10 DIAGNOSIS — N1339 Other hydronephrosis: Secondary | ICD-10-CM

## 2016-11-10 HISTORY — PX: IR NEPHROSTOGRAM LEFT THRU EXISTING ACCESS: IMG6061

## 2016-11-10 HISTORY — PX: IR NEPHROSTOGRAM RIGHT THRU EXISTING ACCESS: IMG6062

## 2016-11-10 LAB — BASIC METABOLIC PANEL
Anion gap: 9 (ref 5–15)
BUN: 17 mg/dL (ref 6–20)
CHLORIDE: 104 mmol/L (ref 101–111)
CO2: 26 mmol/L (ref 22–32)
Calcium: 9.4 mg/dL (ref 8.9–10.3)
Creatinine, Ser: 1.76 mg/dL — ABNORMAL HIGH (ref 0.61–1.24)
GFR calc Af Amer: >60 mL/min (ref >60)
GFR calc non Af Amer: 54 mL/min — ABNORMAL LOW (ref >60)
GLUCOSE: 95 mg/dL (ref 65–99)
Potassium: 4 mmol/L (ref 3.5–5.1)
Sodium: 139 mmol/L (ref 135–145)

## 2016-11-10 MED ORDER — IOPAMIDOL (ISOVUE-300) INJECTION 61%
INTRAVENOUS | Status: AC
  Administered 2016-11-10: 30 mL
  Filled 2016-11-10: qty 100

## 2016-11-10 MED ORDER — IOPAMIDOL (ISOVUE-300) INJECTION 61%
100.0000 mL | Freq: Once | INTRAVENOUS | Status: AC | PRN
  Administered 2016-11-10: 30 mL

## 2016-11-14 ENCOUNTER — Other Ambulatory Visit: Payer: Self-pay | Admitting: Radiology

## 2016-11-15 ENCOUNTER — Encounter (HOSPITAL_COMMUNITY): Payer: Self-pay | Admitting: Diagnostic Radiology

## 2016-11-15 ENCOUNTER — Ambulatory Visit (HOSPITAL_COMMUNITY)
Admission: RE | Admit: 2016-11-15 | Discharge: 2016-11-15 | Disposition: A | Discharge status: Home / Self Care | Payer: BLUE CROSS/BLUE SHIELD | Source: Ambulatory Visit | Attending: Interventional Radiology | Admitting: Interventional Radiology

## 2016-11-15 ENCOUNTER — Other Ambulatory Visit (HOSPITAL_COMMUNITY): Payer: Self-pay | Admitting: Interventional Radiology

## 2016-11-15 DIAGNOSIS — Z436 Encounter for attention to other artificial openings of urinary tract: Secondary | ICD-10-CM | POA: Diagnosis present

## 2016-11-15 DIAGNOSIS — N1339 Other hydronephrosis: Secondary | ICD-10-CM

## 2016-11-15 DIAGNOSIS — N400 Enlarged prostate without lower urinary tract symptoms: Secondary | ICD-10-CM

## 2016-11-15 DIAGNOSIS — N3090 Cystitis, unspecified without hematuria: Secondary | ICD-10-CM | POA: Diagnosis not present

## 2016-11-15 HISTORY — PX: IR NEPHROSTOGRAM LEFT THRU EXISTING ACCESS: IMG6061

## 2016-11-15 HISTORY — PX: IR NEPHROSTOGRAM RIGHT THRU EXISTING ACCESS: IMG6062

## 2016-11-15 MED ORDER — IOPAMIDOL (ISOVUE-300) INJECTION 61%
INTRAVENOUS | Status: AC
  Filled 2016-11-15: qty 100

## 2016-11-15 MED ORDER — IOPAMIDOL (ISOVUE-300) INJECTION 61%
50.0000 mL | Freq: Once | INTRAVENOUS | Status: AC | PRN
  Administered 2016-11-15: 30 mL via INTRAVENOUS

## 2016-11-22 ENCOUNTER — Ambulatory Visit (HOSPITAL_COMMUNITY)
Admission: RE | Admit: 2016-11-22 | Discharge: 2016-11-22 | Disposition: A | Discharge status: Home / Self Care | Payer: BLUE CROSS/BLUE SHIELD | Source: Ambulatory Visit | Attending: Interventional Radiology | Admitting: Interventional Radiology

## 2016-11-22 ENCOUNTER — Other Ambulatory Visit (HOSPITAL_COMMUNITY): Payer: Self-pay | Admitting: Interventional Radiology

## 2016-11-22 ENCOUNTER — Encounter (HOSPITAL_COMMUNITY): Payer: Self-pay | Admitting: Interventional Radiology

## 2016-11-22 DIAGNOSIS — N3090 Cystitis, unspecified without hematuria: Secondary | ICD-10-CM | POA: Diagnosis not present

## 2016-11-22 DIAGNOSIS — N1339 Other hydronephrosis: Secondary | ICD-10-CM

## 2016-11-22 DIAGNOSIS — N400 Enlarged prostate without lower urinary tract symptoms: Secondary | ICD-10-CM

## 2016-11-22 DIAGNOSIS — N1330 Unspecified hydronephrosis: Secondary | ICD-10-CM | POA: Diagnosis not present

## 2016-11-22 DIAGNOSIS — Z436 Encounter for attention to other artificial openings of urinary tract: Secondary | ICD-10-CM | POA: Insufficient documentation

## 2016-11-22 HISTORY — PX: IR NEPHROSTOGRAM LEFT THRU EXISTING ACCESS: IMG6061

## 2016-11-22 HISTORY — PX: IR NEPHROSTOGRAM RIGHT THRU EXISTING ACCESS: IMG6062

## 2016-11-22 LAB — BASIC METABOLIC PANEL
Anion gap: 11 (ref 5–15)
BUN: 14 mg/dL (ref 6–20)
CALCIUM: 9.4 mg/dL (ref 8.9–10.3)
CHLORIDE: 101 mmol/L (ref 101–111)
CO2: 26 mmol/L (ref 22–32)
Creatinine, Ser: 1.79 mg/dL — ABNORMAL HIGH (ref 0.61–1.24)
GFR calc Af Amer: >60 mL/min (ref >60)
GFR, EST NON AFRICAN AMERICAN: 53 mL/min — AB (ref >60)
Glucose, Bld: 122 mg/dL — ABNORMAL HIGH (ref 65–99)
POTASSIUM: 3.5 mmol/L (ref 3.5–5.1)
SODIUM: 138 mmol/L (ref 135–145)

## 2016-11-22 MED ORDER — IOPAMIDOL (ISOVUE-300) INJECTION 61%
INTRAVENOUS | Status: AC
  Administered 2016-11-22: 20 mL
  Filled 2016-11-22: qty 50

## 2016-11-22 MED ORDER — IOPAMIDOL (ISOVUE-300) INJECTION 61%
50.0000 mL | Freq: Once | INTRAVENOUS | Status: AC | PRN
  Administered 2016-11-22: 20 mL

## 2016-12-19 NOTE — Addendum Note (Signed)
Addendum  created 12/19/16 1306 by Shivansh Hardaway, MD   Sign clinical note    

## 2018-03-25 IMAGING — NM NM RENAL IMAGING FLOW W/ PHARM
2 series · 12 of 12 positions shown · non-contrast
Comparison: CT scan June 02, 2016

CLINICAL DATA: Bilateral hydronephrosis.

EXAM:
NUCLEAR MEDICINE RENAL SCAN WITH DIURETIC ADMINISTRATION
TECHNIQUE: Radionuclide angiographic and sequential renal images were obtained
after intravenous injection of radiopharmaceutical. Imaging was
continued during slow intravenous injection of Lasix approximately
15 minutes after the start of the examination.
RADIOPHARMACEUTICALS:  27.27 mCi Dechnetium-HHm MAG3 IV

[Series 1: renal scan · 4.14mm/px · 6 of 40 frames shown (1 of 2)]
[frame 4/40  full-range]
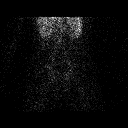
[frame 10/40  full-range]
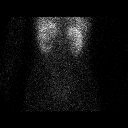
[frame 17/40  full-range]
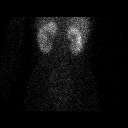
[frame 24/40  full-range]
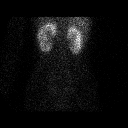
[frame 30/40  full-range]
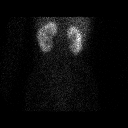
[frame 37/40  full-range]
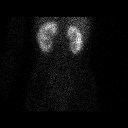

[Series 1: renal scan · 4.14mm/px · 6 of 78 frames shown (2 of 2)]
[frame 7/78]
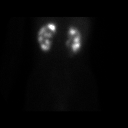
[frame 20/78]
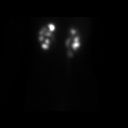
[frame 33/78]
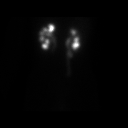
[frame 46/78]
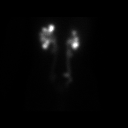
[frame 59/78]
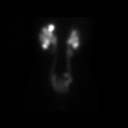
[frame 72/78]
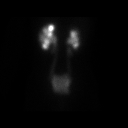

[12 of 12 positions shown; findings below may reference images not displayed]

FINDINGS: Flow:  Prompt symmetric arterial flow to the kidneys.

Left renogram: There is normal time to maximum cortical uptake and
normal time to excretion. However, the renal pelvis, calices, and
ureter are dilated and only partially washout after Lasix.

Right renogram: There is normal time to maximum cortical uptake and
normal time to excretion. However, the renal pelvis, calices, and
ureter are dilated and only partially washout after Lasix

Differential:

Left kidney = 52 %

Right kidney = 48 %

T1/2 post Lasix :

Left kidney = the value could not be calculated in the left kidney
as more than half of the activity remains at the end of the study.

Right kidney = the value could not be calculated in the right kidney
as more than half of the activity remains at the end of the study.
IMPRESSION: This study is consistent with partial obstruction. No high-grade or
complete obstruction identified. There is normal perfusion, cortical
uptake, and normal time to excretion. However, there is bilateral
hydronephrosis and ureterectasis which only partially washes out by
the end of the study.
